# Patient Record
Sex: Female | Born: 1950
Health system: Southern US, Community
[De-identification: ages and names within clinical notes are randomized; demographics above are authoritative.]

## PROBLEM LIST (undated history)

## (undated) DIAGNOSIS — Z720 Tobacco use: Secondary | ICD-10-CM

## (undated) DIAGNOSIS — E611 Iron deficiency: Secondary | ICD-10-CM

## (undated) DIAGNOSIS — N83209 Unspecified ovarian cyst, unspecified side: Secondary | ICD-10-CM

## (undated) DIAGNOSIS — S92901A Unspecified fracture of right foot, initial encounter for closed fracture: Secondary | ICD-10-CM

## (undated) DIAGNOSIS — C801 Malignant (primary) neoplasm, unspecified: Secondary | ICD-10-CM

## (undated) DIAGNOSIS — K029 Dental caries, unspecified: Secondary | ICD-10-CM

## (undated) DIAGNOSIS — F329 Major depressive disorder, single episode, unspecified: Secondary | ICD-10-CM

## (undated) DIAGNOSIS — A159 Respiratory tuberculosis unspecified: Secondary | ICD-10-CM

## (undated) DIAGNOSIS — F32A Depression, unspecified: Secondary | ICD-10-CM

## (undated) DIAGNOSIS — J45909 Unspecified asthma, uncomplicated: Secondary | ICD-10-CM

## (undated) DIAGNOSIS — D649 Anemia, unspecified: Secondary | ICD-10-CM

## (undated) HISTORY — DX: Anemia, unspecified: D64.9

## (undated) HISTORY — PX: INGUINAL HERNIA REPAIR: SUR1180

## (undated) HISTORY — PX: BREAST EXCISIONAL BIOPSY: SUR124

## (undated) HISTORY — PX: COLONOSCOPY: SHX174

## (undated) HISTORY — DX: Respiratory tuberculosis unspecified: A15.9

## (undated) HISTORY — DX: Malignant (primary) neoplasm, unspecified: C80.1

## (undated) HISTORY — PX: DILATION AND CURETTAGE OF UTERUS: SHX78

## (undated) HISTORY — DX: Depression, unspecified: F32.A

## (undated) HISTORY — DX: Major depressive disorder, single episode, unspecified: F32.9

---
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
## 1973-08-07 DIAGNOSIS — N83209 Unspecified ovarian cyst, unspecified side: Secondary | ICD-10-CM

## 1973-08-07 HISTORY — PX: OVARIAN CYST REMOVAL: SHX89

## 1973-08-07 HISTORY — DX: Unspecified ovarian cyst, unspecified side: N83.209

## 1997-12-05 ENCOUNTER — Emergency Department (HOSPITAL_COMMUNITY): Admission: EM | Admit: 1997-12-05 | Discharge: 1997-12-05 | Payer: Self-pay | Admitting: Emergency Medicine

## 1999-12-26 ENCOUNTER — Emergency Department (HOSPITAL_COMMUNITY): Admission: EM | Admit: 1999-12-26 | Discharge: 1999-12-26 | Payer: Self-pay | Admitting: Family Medicine

## 2000-04-03 ENCOUNTER — Other Ambulatory Visit: Admission: RE | Admit: 2000-04-03 | Discharge: 2000-04-03 | Payer: Self-pay | Admitting: Obstetrics and Gynecology

## 2000-04-05 ENCOUNTER — Encounter: Payer: Self-pay | Admitting: Obstetrics and Gynecology

## 2000-04-05 ENCOUNTER — Ambulatory Visit (HOSPITAL_COMMUNITY): Admission: RE | Admit: 2000-04-05 | Discharge: 2000-04-05 | Payer: Self-pay | Admitting: Obstetrics and Gynecology

## 2000-04-12 ENCOUNTER — Other Ambulatory Visit: Admission: RE | Admit: 2000-04-12 | Discharge: 2000-04-12 | Payer: Self-pay | Admitting: Obstetrics and Gynecology

## 2000-07-03 ENCOUNTER — Emergency Department (HOSPITAL_COMMUNITY): Admission: EM | Admit: 2000-07-03 | Discharge: 2000-07-04 | Payer: Self-pay | Admitting: *Deleted

## 2000-07-12 ENCOUNTER — Encounter: Payer: Self-pay | Admitting: Sports Medicine

## 2000-07-12 ENCOUNTER — Encounter: Admission: RE | Admit: 2000-07-12 | Discharge: 2000-07-12 | Payer: Self-pay | Admitting: Sports Medicine

## 2002-11-29 ENCOUNTER — Encounter: Payer: Self-pay | Admitting: Emergency Medicine

## 2002-11-29 ENCOUNTER — Emergency Department (HOSPITAL_COMMUNITY): Admission: EM | Admit: 2002-11-29 | Discharge: 2002-11-29 | Payer: Self-pay | Admitting: Emergency Medicine

## 2002-12-10 ENCOUNTER — Emergency Department (HOSPITAL_COMMUNITY): Admission: EM | Admit: 2002-12-10 | Discharge: 2002-12-10 | Payer: Self-pay | Admitting: Emergency Medicine

## 2002-12-10 ENCOUNTER — Encounter: Payer: Self-pay | Admitting: Emergency Medicine

## 2014-08-07 HISTORY — PX: COLON SURGERY: SHX602

## 2015-04-04 ENCOUNTER — Emergency Department (HOSPITAL_COMMUNITY)
Admission: EM | Admit: 2015-04-04 | Discharge: 2015-04-05 | Disposition: A | Discharge status: Home / Self Care | Payer: Medicare Other | Attending: Emergency Medicine | Admitting: Emergency Medicine

## 2015-04-04 ENCOUNTER — Encounter (HOSPITAL_COMMUNITY): Payer: Self-pay

## 2015-04-04 DIAGNOSIS — K561 Intussusception: Secondary | ICD-10-CM | POA: Insufficient documentation

## 2015-04-04 DIAGNOSIS — K625 Hemorrhage of anus and rectum: Secondary | ICD-10-CM

## 2015-04-04 DIAGNOSIS — J45909 Unspecified asthma, uncomplicated: Secondary | ICD-10-CM | POA: Diagnosis not present

## 2015-04-04 DIAGNOSIS — Z720 Tobacco use: Secondary | ICD-10-CM | POA: Diagnosis not present

## 2015-04-04 DIAGNOSIS — R42 Dizziness and giddiness: Secondary | ICD-10-CM | POA: Insufficient documentation

## 2015-04-04 DIAGNOSIS — Z8742 Personal history of other diseases of the female genital tract: Secondary | ICD-10-CM | POA: Insufficient documentation

## 2015-04-04 HISTORY — DX: Unspecified ovarian cyst, unspecified side: N83.209

## 2015-04-04 HISTORY — DX: Unspecified asthma, uncomplicated: J45.909

## 2015-04-04 NOTE — ED Provider Notes (Signed)
CSN: [id number]     Arrival date & time 04/04/15  2340 History   This chart was scribed for No att. providers found by Forrestine Him, ED Scribe. This patient was seen in room OTFC/OTF and the patient's care was started 11:48 PM.   Chief Complaint  Patient presents with  . Abdominal Pain  . Rectal Bleeding   The history is provided by the patient. No language interpreter was used.    HPI Comments: Carol Dominguez brought in by EMS is a 64 y.o. female without any pertinent past medical history  who presents to the Emergency Department complaining of intermittent, ongoing rectal bleeding x 6 months; worsened this evening. She denies passing blood with every bowel movement but states she noted a steady stream of blood along with blood clots in her stool prior to arrival. Ms. Carol Dominguez also reports associated abdominal pain onset this evening. She admits to feeling lightheaded while sitting on the toilet this evening. No OTC medications or home remedies attempted prior to arrival. No recent fever or chills. No previous follow up with GI. Family history of hemorrhoids reported.  Past Medical History  Diagnosis Date  . Ovarian cyst   . Asthma    History reviewed. No pertinent past surgical history. History reviewed. No pertinent family history. Social History  Substance Use Topics  . Smoking status: Current Every Day Smoker    Types: Cigarettes  . Smokeless tobacco: None  . Alcohol Use: No   OB History    No data available     Review of Systems  Constitutional: Negative for fever and chills.  Respiratory: Negative for cough, shortness of breath and wheezing.   Cardiovascular: Negative for chest pain.  Gastrointestinal: Positive for abdominal pain and blood in stool. Negative for nausea, vomiting, diarrhea and constipation.  Musculoskeletal: Negative for myalgias, back pain, neck pain and neck stiffness.  Skin: Negative for rash and wound.  Neurological: Positive for dizziness and  light-headedness. Negative for syncope, weakness, numbness and headaches.  Psychiatric/Behavioral: Negative for confusion.  All other systems reviewed and are negative.     Allergies  Codeine  Home Medications   Prior to Admission medications   Medication Sig Start Date End Date Taking? Authorizing Provider  ibuprofen (ADVIL,MOTRIN) 200 MG tablet Take 400 mg by mouth every 6 (six) hours as needed for moderate pain.   Yes Historical Provider, MD  docusate sodium (COLACE) 100 MG capsule Take 1 capsule (100 mg total) by mouth every 12 (twelve) hours. 04/05/15   Evelina Bucy, MD  psyllium (METAMUCIL) 0.52 G capsule Take 1 capsule (0.52 g total) by mouth daily. 04/05/15   Evelina Bucy, MD   Triage Vitals: BP 123/57 mmHg  Pulse 53  Resp 11  SpO2 98%   Physical Exam  Constitutional: She is oriented to person, place, and time. She appears well-developed and well-nourished. No distress.  HENT:  Head: Normocephalic and atraumatic.  Mouth/Throat: Oropharynx is clear and moist.  Eyes: EOM are normal. Pupils are equal, round, and reactive to light.  Neck: Normal range of motion. Neck supple.  Cardiovascular: Normal rate and regular rhythm.   Pulmonary/Chest: Effort normal and breath sounds normal. No respiratory distress. She has no wheezes. She has no rales. She exhibits no tenderness.  Abdominal: Soft. Bowel sounds are normal. She exhibits no distension and no mass. There is tenderness (very mild left lower abdominal tenderness to palpation. There is no rebound or guarding.). There is no rebound and no guarding.  Genitourinary:  Gross red blood on rectal exam. No fissures or hemorrhoids appreciated.  Musculoskeletal: Normal range of motion. She exhibits no edema or tenderness.  Neurological: She is alert and oriented to person, place, and time.  Skin: Skin is warm and dry. No rash noted. No erythema.  Psychiatric: She has a normal mood and affect. Her behavior is normal.  Nursing note and  vitals reviewed.   ED Course  Procedures (including critical care time)  DIAGNOSTIC STUDIES: Oxygen Saturation is 99% on room air, normal by my interpretation.    COORDINATION OF CARE: 11:57 PM-Discussed treatment plan with pt at bedside and pt agreed to plan.     Labs Review Labs Reviewed  CBC WITH DIFFERENTIAL/PLATELET - Abnormal; Notable for the following:    RBC 3.71 (*)    Hemoglobin 10.9 (*)    HCT 33.2 (*)    Eosinophils Relative 7 (*)    All other components within normal limits  COMPREHENSIVE METABOLIC PANEL - Abnormal; Notable for the following:    Glucose, Bld 117 (*)    Creatinine, Ser 1.32 (*)    Calcium 8.0 (*)    Total Protein 4.5 (*)    Albumin 2.4 (*)    Total Bilirubin 0.2 (*)    GFR calc non Af Amer 42 (*)    GFR calc Af Amer 49 (*)    Anion gap 4 (*)    All other components within normal limits  LIPASE, BLOOD - Abnormal; Notable for the following:    Lipase 18 (*)    All other components within normal limits  POC OCCULT BLOOD, ED - Abnormal; Notable for the following:    Fecal Occult Bld POSITIVE (*)    All other components within normal limits  PROTIME-INR  APTT  TYPE AND SCREEN  ABO/RH    Imaging Review Ct Abdomen Pelvis W Contrast  04/05/2015   CLINICAL DATA:  Intermittent rectal bleeding for 6 months, today much worse.  EXAM: CT ABDOMEN AND PELVIS WITH CONTRAST  TECHNIQUE: Multidetector CT imaging of the abdomen and pelvis was performed using the standard protocol following bolus administration of intravenous contrast.  CONTRAST:  128mL OMNIPAQUE IOHEXOL 300 MG/ML  SOLN  COMPARISON:  None.  FINDINGS: There is a colocolic intussusception of the sigmoid into the rectum. At the leading edge of the intussusception there is a 3 cm enhancing soft tissue lesion which may represent a neoplasm. There is no significant bowel obstruction associated with the intussusception. 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 There is no extraluminal air. There is no pneumatosis.  There are normal appearances of the liver, spleen, pancreas, adrenals and kidneys. The abdominal aorta is normal in caliber with moderate atherosclerotic calcification. There is mild posteromedial left lower lobe pleural parenchymal opacity which probably represents scarring. There is no significant skeletal lesion. Moderate lumbar degenerative disc changes are present from L 3 through L5.  IMPRESSION: 1. Intussusception of the sigmoid into the rectum, with abnormal enhancing soft tissue at the leading edge of the intussusception suspicious for a 3 cm neoplasm. These results were called by telephone at the time of interpretation on 04/05/2015 at 2:59 am to Dr. Julianne Rice , who verbally acknowledged these results. 2. No significant bowel obstruction associated with the intussusception. 3. 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   Electronically  Signed   By: Andreas Newport M.D.   On: 04/05/2015 03:06   I have personally reviewed and evaluated these images and lab results as part of my medical decision-making.   EKG Interpretation None      MDM   Final diagnoses:  Intussusception  Rectal bleeding    I personally performed the services described in this documentation, which was scribed in my presence. The recorded information has been reviewed and is accurate.  Patient appears comfortable. Discussed with general surgery, Dr. Barry Dienes. Will evaluate in the emergency department.  Signed out to oncoming EDP pending dispo by surgery  Julianne Rice, MD 04/06/15 [phone]

## 2015-04-04 NOTE — ED Notes (Signed)
Per EMS: PT complaining of passing of clots while having a BM x 6 months. Last 2 weeks has noticed some bright red blood in addition to the clots. Today, noticed a steady stream of blood with BM. States family hx of hemmroids. Also complains of LL abdominal pain on palpation. BP 142/84, 76bpm, 98% RA.

## 2015-04-05 ENCOUNTER — Encounter (HOSPITAL_COMMUNITY): Payer: Self-pay | Admitting: Radiology

## 2015-04-05 ENCOUNTER — Emergency Department (HOSPITAL_COMMUNITY): Payer: Medicare Other

## 2015-04-05 DIAGNOSIS — C20 Malignant neoplasm of rectum: Secondary | ICD-10-CM | POA: Diagnosis not present

## 2015-04-05 DIAGNOSIS — K623 Rectal prolapse: Secondary | ICD-10-CM | POA: Diagnosis not present

## 2015-04-05 LAB — CBC WITH DIFFERENTIAL/PLATELET
Basophils Absolute: 0 10*3/uL (ref 0.0–0.1)
Basophils Relative: 1 % (ref 0–1)
Eosinophils Absolute: 0.4 10*3/uL (ref 0.0–0.7)
Eosinophils Relative: 7 % — ABNORMAL HIGH (ref 0–5)
HEMATOCRIT: 33.2 % — AB (ref 36.0–46.0)
HEMOGLOBIN: 10.9 g/dL — AB (ref 12.0–15.0)
LYMPHS ABS: 1.7 10*3/uL (ref 0.7–4.0)
LYMPHS PCT: 31 % (ref 12–46)
MCH: 29.4 pg (ref 26.0–34.0)
MCHC: 32.8 g/dL (ref 30.0–36.0)
MCV: 89.5 fL (ref 78.0–100.0)
Monocytes Absolute: 0.4 10*3/uL (ref 0.1–1.0)
Monocytes Relative: 7 % (ref 3–12)
NEUTROS ABS: 3 10*3/uL (ref 1.7–7.7)
NEUTROS PCT: 54 % (ref 43–77)
Platelets: 272 10*3/uL (ref 150–400)
RBC: 3.71 MIL/uL — AB (ref 3.87–5.11)
RDW: 13.7 % (ref 11.5–15.5)
WBC: 5.5 10*3/uL (ref 4.0–10.5)

## 2015-04-05 LAB — COMPREHENSIVE METABOLIC PANEL
ALK PHOS: 56 U/L (ref 38–126)
ALT: 14 U/L (ref 14–54)
AST: 20 U/L (ref 15–41)
Albumin: 2.4 g/dL — ABNORMAL LOW (ref 3.5–5.0)
Anion gap: 4 — ABNORMAL LOW (ref 5–15)
BUN: 15 mg/dL (ref 6–20)
CALCIUM: 8 mg/dL — AB (ref 8.9–10.3)
CO2: 28 mmol/L (ref 22–32)
CREATININE: 1.32 mg/dL — AB (ref 0.44–1.00)
Chloride: 108 mmol/L (ref 101–111)
GFR, EST AFRICAN AMERICAN: 49 mL/min — AB (ref >60)
GFR, EST NON AFRICAN AMERICAN: 42 mL/min — AB (ref >60)
Glucose, Bld: 117 mg/dL — ABNORMAL HIGH (ref 65–99)
Potassium: 4 mmol/L (ref 3.5–5.1)
SODIUM: 140 mmol/L (ref 135–145)
Total Bilirubin: 0.2 mg/dL — ABNORMAL LOW (ref 0.3–1.2)
Total Protein: 4.5 g/dL — ABNORMAL LOW (ref 6.5–8.1)

## 2015-04-05 LAB — TYPE AND SCREEN
ABO/RH(D): B POS
ANTIBODY SCREEN: NEGATIVE

## 2015-04-05 LAB — ABO/RH: ABO/RH(D): B POS

## 2015-04-05 LAB — POC OCCULT BLOOD, ED: Fecal Occult Bld: POSITIVE — AB

## 2015-04-05 LAB — APTT: aPTT: 29 seconds (ref 24–37)

## 2015-04-05 LAB — PROTIME-INR
INR: 1.05 (ref 0.00–1.49)
PROTHROMBIN TIME: 13.9 s (ref 11.6–15.2)

## 2015-04-05 LAB — LIPASE, BLOOD: Lipase: 18 U/L — ABNORMAL LOW (ref 22–51)

## 2015-04-05 MED ORDER — IOHEXOL 300 MG/ML  SOLN: 25.0000 mL | Freq: Once | INTRAMUSCULAR | Status: DC | PRN

## 2015-04-05 MED ORDER — HYDROMORPHONE HCL 1 MG/ML IJ SOLN
1.0000 mg | Freq: Once | INTRAMUSCULAR | Status: AC
  Administered 2015-04-05: 1 mg via INTRAVENOUS
  Filled 2015-04-05: qty 1

## 2015-04-05 MED ORDER — IOHEXOL 300 MG/ML  SOLN
100.0000 mL | Freq: Once | INTRAMUSCULAR | Status: AC | PRN
  Administered 2015-04-05: 100 mL via INTRAVENOUS

## 2015-04-05 MED ORDER — PSYLLIUM 0.52 G PO CAPS: 0.5200 g | ORAL_CAPSULE | Freq: Every day | ORAL | Status: DC

## 2015-04-05 MED ORDER — DOCUSATE SODIUM 100 MG PO CAPS: 100.0000 mg | ORAL_CAPSULE | Freq: Two times a day (BID) | ORAL | Status: DC

## 2015-04-05 NOTE — ED Notes (Signed)
Pt taken to lobby in wheelchair by NT. Pt states she will take the bus to go home.

## 2015-04-05 NOTE — ED Notes (Signed)
Surgery at bedside.

## 2015-04-05 NOTE — ED Notes (Signed)
Reviewed D/C papers with pt - pt requests to ambulate to restroom. While in restroom pt calls out with call bell stating "it's coming out again." This RN went in restroom, noted intestines coming out of rectum, pt c/o pain in abdomen. EDP made aware. Wet sterile gauze and abd pad placed on intestines.

## 2015-04-05 NOTE — Progress Notes (Signed)
Called back by Dr. Mingo Amber because the patients rectum re-prolapsed.  He thought there was some type of polyp on it and he wanted [place] to re-evaluate her to see if she warranted inpatient or needed more urgent follow up.  There is a ulcerated lesion, unsure if its is a internal hemorrhoid or ulcerative mass.  The ulcerative area is approximately 3cm x 1.5cm.  The rest of the mucosa is smooth and pink as below.  I easily pushed in the rectum with little effort with a sterile water soaked gauze.  Instructed the patient on re-inserting it at home.  She may need to use the shower/bath to help it go in more easily.  She will need to be on colace, miralax to keep the stools loose.    I've made her an appointment on September 6th at 10:30 with Dr. Marcello Moores our colorectal specialist, she should arrive by 10:00am to check in and fill out paperwork.       Jomarie Longs, PA-C General Surgery [facility] [phone]

## 2015-04-05 NOTE — Consult Note (Signed)
Reason for Consult:blood per rectum Referring Physician: Ferrel Logan is an 64 y.o. female.  HPI:  Patient is a 64 yo F who presents with new onset abdominal pain with worsening blood per rectum.  She has had blood per rectum for around 6 months or so, but this evening, she had a larger amount of blood and some clots.  She has no PCP and does not go to doctors.  She has not had any recent mammogram or GI doctor.  She denies any family or personal history of cancer or GI diseases other than hemorrhoids.  She has assumed the bleeding was related to hemorrhoids.  She does sometimes feel tissue coming out of her rectum.  The pain today described as mild to moderate.  The pain was concerning to her.      Past Medical History  Diagnosis Date  . Ovarian cyst   . Asthma     History reviewed. No pertinent past surgical history.  History reviewed. No pertinent family history.  Social History:  reports that she has been smoking Cigarettes.  She does not have any smokeless tobacco history on file. She reports that she does not drink alcohol or use illicit drugs.  Allergies:  Allergies  Allergen Reactions  . Codeine     "Makes me sick."    Medications: ibuprofen  Results for orders placed or performed during the hospital encounter of 04/04/15 (from the past 48 hour(s))  POC occult blood, ED Provider will collect     Status: Abnormal   Collection Time: 04/05/15 12:11 AM  Result Value Ref Range   Fecal Occult Bld POSITIVE (A) NEGATIVE  CBC with Differential/Platelet     Status: Abnormal   Collection Time: 04/05/15 12:18 AM  Result Value Ref Range   WBC 5.5 4.0 - 10.5 K/uL   RBC 3.71 (L) 3.87 - 5.11 MIL/uL   Hemoglobin 10.9 (L) 12.0 - 15.0 g/dL   HCT 33.2 (L) 36.0 - 46.0 %   MCV 89.5 78.0 - 100.0 fL   MCH 29.4 26.0 - 34.0 pg   MCHC 32.8 30.0 - 36.0 g/dL   RDW 13.7 11.5 - 15.5 %   Platelets 272 150 - 400 K/uL   Neutrophils Relative % 54 43 - 77 %   Neutro Abs 3.0 1.7  - 7.7 K/uL   Lymphocytes Relative 31 12 - 46 %   Lymphs Abs 1.7 0.7 - 4.0 K/uL   Monocytes Relative 7 3 - 12 %   Monocytes Absolute 0.4 0.1 - 1.0 K/uL   Eosinophils Relative 7 (H) 0 - 5 %   Eosinophils Absolute 0.4 0.0 - 0.7 K/uL   Basophils Relative 1 0 - 1 %   Basophils Absolute 0.0 0.0 - 0.1 K/uL  Comprehensive metabolic panel     Status: Abnormal   Collection Time: 04/05/15 12:18 AM  Result Value Ref Range   Sodium 140 135 - 145 mmol/L   Potassium 4.0 3.5 - 5.1 mmol/L   Chloride 108 101 - 111 mmol/L   CO2 28 22 - 32 mmol/L   Glucose, Bld 117 (H) 65 - 99 mg/dL   BUN 15 6 - 20 mg/dL   Creatinine, Ser 1.32 (H) 0.44 - 1.00 mg/dL   Calcium 8.0 (L) 8.9 - 10.3 mg/dL   Total Protein 4.5 (L) 6.5 - 8.1 g/dL   Albumin 2.4 (L) 3.5 - 5.0 g/dL   AST 20 15 - 41 U/L   ALT 14 14 - 54  U/L   Alkaline Phosphatase 56 38 - 126 U/L   Total Bilirubin 0.2 (L) 0.3 - 1.2 mg/dL   GFR calc non Af Amer 42 (L) >60 mL/min   GFR calc Af Amer 49 (L) >60 mL/min    Comment: (NOTE) The eGFR has been calculated using the CKD EPI equation. This calculation has not been validated in all clinical situations. eGFR's persistently <60 mL/min signify possible Chronic Kidney Disease.    Anion gap 4 (L) 5 - 15  Lipase, blood     Status: Abnormal   Collection Time: 04/05/15 12:18 AM  Result Value Ref Range   Lipase 18 (L) 22 - 51 U/L  Protime-INR     Status: None   Collection Time: 04/05/15 12:18 AM  Result Value Ref Range   Prothrombin Time 13.9 11.6 - 15.2 seconds   INR 1.05 0.00 - 1.49  APTT     Status: None   Collection Time: 04/05/15 12:18 AM  Result Value Ref Range   aPTT 29 24 - 37 seconds  Type and screen     Status: None   Collection Time: 04/05/15 12:18 AM  Result Value Ref Range   ABO/RH(D) B POS    Antibody Screen NEG    Sample Expiration 04/08/2015   ABO/Rh     Status: None   Collection Time: 04/05/15 12:18 AM  Result Value Ref Range   ABO/RH(D) B POS     Ct Abdomen Pelvis W  Contrast  04/05/2015   CLINICAL DATA:  Intermittent rectal bleeding for 6 months, today much worse.  EXAM: CT ABDOMEN AND PELVIS WITH CONTRAST  TECHNIQUE: Multidetector CT imaging of the abdomen and pelvis was performed using the standard protocol following bolus administration of intravenous contrast.  CONTRAST:  147m OMNIPAQUE IOHEXOL 300 MG/ML  SOLN  COMPARISON:  None.  FINDINGS: There is a colocolic intussusception of the sigmoid into the rectum. At the leading edge of the intussusception there is a 3 cm enhancing soft tissue lesion which may represent a neoplasm. There is no significant bowel obstruction associated with the intussusception. 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 There is no extraluminal air. There is no pneumatosis.  There are normal appearances of the liver, spleen, pancreas, adrenals and kidneys. The abdominal aorta is normal in caliber with moderate atherosclerotic calcification. There is mild posteromedial left lower lobe pleural parenchymal opacity which probably represents scarring. There is no significant skeletal lesion. Moderate lumbar degenerative disc changes are present from L 3 through L5.  IMPRESSION: 1. Intussusception of the sigmoid into the rectum, with abnormal enhancing soft tissue at the leading edge of the intussusception suspicious for a 3 cm neoplasm. These results were called by telephone at the time of interpretation on 04/05/2015 at 2:59 am to Dr. DJulianne Rice, who verbally acknowledged these results. 2. No significant bowel obstruction associated with the intussusception. 3. 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   Electronically Signed   By: DAndreas NewportM.D.   On: 04/05/2015 03:06    Review of Systems  Constitutional: Negative.   HENT: Negative.   Eyes: Negative.   Respiratory:  Negative.   Cardiovascular: Negative.   Gastrointestinal: Positive for abdominal pain and blood in stool. Negative for nausea and vomiting.  Genitourinary: Negative.   Musculoskeletal: Negative.   Skin: Negative.  Neurological: Negative.   Endo/Heme/Allergies: Negative.   Psychiatric/Behavioral: Negative.    Blood pressure 166/84, pulse 76, resp. rate 18, SpO2 99 %. Physical Exam  Constitutional: She is oriented to person, place, and time. She appears well-developed and well-nourished. No distress.  HENT:  Head: Normocephalic and atraumatic.  Eyes: Conjunctivae are normal. Pupils are equal, round, and reactive to light. No scleral icterus.  Neck: Neck supple. No JVD present. No tracheal deviation present. No thyromegaly present.  Cardiovascular: Normal rate, regular rhythm and intact distal pulses.   Respiratory: Effort normal. No respiratory distress.  GI: Soft. She exhibits no distension and no mass. There is no tenderness. There is no rebound and no guarding.  Musculoskeletal: She exhibits no edema or tenderness.  Lymphadenopathy:    She has no cervical adenopathy.  Neurological: She is alert and oriented to person, place, and time.  Skin: Skin is warm and dry. No rash noted. She is not diaphoretic. No erythema. No pallor.  Psychiatric: She has a normal mood and affect. Her behavior is normal. Judgment and thought content normal.    Assessment/Plan: GI Bleed Sigmoid intussusception Malrotation Colon mass  High fiber diet. Avoid prolonged time on the toilet Would recommend workup with colonoscopy. Malrotation appears to be compensated by the patient with small bowel in right abdomen and colon in left abdomen.   Does not need urgent surgical intervention.    Patient seems stable for discharge. Would recommend accelerated outpatient workup as long as she remains stable for discharge. Will set up appointment with Dr. Marcello Moores at our practice within 1 week.       Timothea Bodenheimer 04/05/2015, 10:36 AM

## 2015-04-05 NOTE — ED Notes (Signed)
Pt previously signed for d/c instructions - verbalized understanding, no further questions/concerns.

## 2015-04-05 NOTE — Discharge Instructions (Signed)
Please avoid sitting on the toilet for extended periods of time. Please take the stool softeners (Colace/Docusate) and the fiber supplements to help alleviate any constipation.  Please follow up with Dr. Marcello Moores with Surgery as scheduled.    Abdominal Pain Many things can cause abdominal pain. Usually, abdominal pain is not caused by a disease and will improve without treatment. It can often be observed and treated at home. Your health care provider will do a physical exam and possibly order blood tests and X-rays to help determine the seriousness of your pain. However, in many cases, more time must pass before a clear cause of the pain can be found. Before that point, your health care provider may not know if you need more testing or further treatment. HOME CARE INSTRUCTIONS  Monitor your abdominal pain for any changes. The following actions may help to alleviate any discomfort you are experiencing:  Only take over-the-counter or prescription medicines as directed by your health care provider.  Do not take laxatives unless directed to do so by your health care provider.  Try a clear liquid diet (broth, tea, or water) as directed by your health care provider. Slowly move to a bland diet as tolerated. SEEK MEDICAL CARE IF:  You have unexplained abdominal pain.  You have abdominal pain associated with nausea or diarrhea.  You have pain when you urinate or have a bowel movement.  You experience abdominal pain that wakes you in the night.  You have abdominal pain that is worsened or improved by eating food.  You have abdominal pain that is worsened with eating fatty foods.  You have a fever. SEEK IMMEDIATE MEDICAL CARE IF:   Your pain does not go away within 2 hours.  You keep throwing up (vomiting).  Your pain is felt only in portions of the abdomen, such as the right side or the left lower portion of the abdomen.  You pass bloody or black tarry stools. MAKE SURE  YOU:  Understand these instructions.   Will watch your condition.   Will get help right away if you are not doing well or get worse.  Document Released: 05/03/2005 Document Revised: 07/29/2013 Document Reviewed: 04/02/2013 [facility] Patient Information 2015 [place], [place]. This information is not intended to replace advice given to you by your health care provider. Make sure you discuss any questions you have with your health care provider.

## 2015-04-08 ENCOUNTER — Inpatient Hospital Stay (HOSPITAL_COMMUNITY): Payer: Medicare Other

## 2015-04-08 ENCOUNTER — Inpatient Hospital Stay (HOSPITAL_COMMUNITY)
Admission: EM | Admit: 2015-04-08 | Discharge: 2015-04-13 | DRG: 330 | Disposition: A | Discharge status: Home Health | Payer: Medicare Other | Attending: Surgery | Admitting: Surgery

## 2015-04-08 ENCOUNTER — Encounter (HOSPITAL_COMMUNITY): Payer: Self-pay | Admitting: Emergency Medicine

## 2015-04-08 DIAGNOSIS — K6289 Other specified diseases of anus and rectum: Secondary | ICD-10-CM | POA: Diagnosis present

## 2015-04-08 DIAGNOSIS — K625 Hemorrhage of anus and rectum: Secondary | ICD-10-CM | POA: Diagnosis present

## 2015-04-08 DIAGNOSIS — Q438 Other specified congenital malformations of intestine: Secondary | ICD-10-CM

## 2015-04-08 DIAGNOSIS — K660 Peritoneal adhesions (postprocedural) (postinfection): Secondary | ICD-10-CM | POA: Diagnosis present

## 2015-04-08 DIAGNOSIS — Z01818 Encounter for other preprocedural examination: Secondary | ICD-10-CM

## 2015-04-08 DIAGNOSIS — Z681 Body mass index (BMI) 19 or less, adult: Secondary | ICD-10-CM | POA: Diagnosis not present

## 2015-04-08 DIAGNOSIS — E46 Unspecified protein-calorie malnutrition: Secondary | ICD-10-CM | POA: Diagnosis present

## 2015-04-08 DIAGNOSIS — K567 Ileus, unspecified: Secondary | ICD-10-CM | POA: Diagnosis not present

## 2015-04-08 DIAGNOSIS — Z720 Tobacco use: Secondary | ICD-10-CM | POA: Diagnosis present

## 2015-04-08 DIAGNOSIS — F17210 Nicotine dependence, cigarettes, uncomplicated: Secondary | ICD-10-CM | POA: Diagnosis present

## 2015-04-08 DIAGNOSIS — K5669 Other intestinal obstruction: Secondary | ICD-10-CM | POA: Diagnosis present

## 2015-04-08 DIAGNOSIS — C801 Malignant (primary) neoplasm, unspecified: Secondary | ICD-10-CM

## 2015-04-08 DIAGNOSIS — K623 Rectal prolapse: Secondary | ICD-10-CM | POA: Diagnosis present

## 2015-04-08 DIAGNOSIS — C20 Malignant neoplasm of rectum: Secondary | ICD-10-CM | POA: Diagnosis present

## 2015-04-08 DIAGNOSIS — E876 Hypokalemia: Secondary | ICD-10-CM | POA: Diagnosis not present

## 2015-04-08 DIAGNOSIS — K561 Intussusception: Secondary | ICD-10-CM | POA: Diagnosis present

## 2015-04-08 DIAGNOSIS — E440 Moderate protein-calorie malnutrition: Secondary | ICD-10-CM

## 2015-04-08 DIAGNOSIS — F40240 Claustrophobia: Secondary | ICD-10-CM

## 2015-04-08 HISTORY — DX: Unspecified fracture of right foot, initial encounter for closed fracture: S92.901A

## 2015-04-08 HISTORY — DX: Malignant (primary) neoplasm, unspecified: C80.1

## 2015-04-08 HISTORY — DX: Dental caries, unspecified: K02.9

## 2015-04-08 HISTORY — DX: Iron deficiency: E61.1

## 2015-04-08 HISTORY — DX: Tobacco use: Z72.0

## 2015-04-08 LAB — CBC
HCT: 35.9 % — ABNORMAL LOW (ref 36.0–46.0)
Hemoglobin: 12 g/dL (ref 12.0–15.0)
MCH: 29.8 pg (ref 26.0–34.0)
MCHC: 33.4 g/dL (ref 30.0–36.0)
MCV: 89.1 fL (ref 78.0–100.0)
PLATELETS: 273 10*3/uL (ref 150–400)
RBC: 4.03 MIL/uL (ref 3.87–5.11)
RDW: 13.4 % (ref 11.5–15.5)
WBC: 6.2 10*3/uL (ref 4.0–10.5)

## 2015-04-08 LAB — COMPREHENSIVE METABOLIC PANEL
ALK PHOS: 46 U/L (ref 38–126)
ALT: 18 U/L (ref 14–54)
AST: 38 U/L (ref 15–41)
Albumin: 2.7 g/dL — ABNORMAL LOW (ref 3.5–5.0)
Anion gap: 6 (ref 5–15)
BUN: 7 mg/dL (ref 6–20)
CALCIUM: 8.3 mg/dL — AB (ref 8.9–10.3)
CO2: 28 mmol/L (ref 22–32)
CREATININE: 1.08 mg/dL — AB (ref 0.44–1.00)
Chloride: 107 mmol/L (ref 101–111)
GFR calc non Af Amer: 54 mL/min — ABNORMAL LOW (ref >60)
GLUCOSE: 100 mg/dL — AB (ref 65–99)
Potassium: 3.5 mmol/L (ref 3.5–5.1)
SODIUM: 141 mmol/L (ref 135–145)
Total Bilirubin: 0.9 mg/dL (ref 0.3–1.2)
Total Protein: 5.3 g/dL — ABNORMAL LOW (ref 6.5–8.1)

## 2015-04-08 LAB — SURGICAL PCR SCREEN
MRSA, PCR: NEGATIVE
Staphylococcus aureus: NEGATIVE

## 2015-04-08 MED ORDER — NEOMYCIN SULFATE 500 MG PO TABS
1000.0000 mg | ORAL_TABLET | Freq: Once | ORAL | Status: AC
  Administered 2015-04-09: 1000 mg via ORAL
  Filled 2015-04-08 (×2): qty 2

## 2015-04-08 MED ORDER — ENOXAPARIN SODIUM 40 MG/0.4ML ~~LOC~~ SOLN: 40.0000 mg | Freq: Once | SUBCUTANEOUS | Status: DC

## 2015-04-08 MED ORDER — LACTATED RINGERS IV BOLUS (SEPSIS): 1000.0000 mL | Freq: Three times a day (TID) | INTRAVENOUS | Status: AC | PRN

## 2015-04-08 MED ORDER — CEFOTETAN DISODIUM 2 G IJ SOLR
2.0000 g | INTRAMUSCULAR | Status: AC
  Administered 2015-04-09: 2 g via INTRAVENOUS
  Filled 2015-04-08: qty 2

## 2015-04-08 MED ORDER — POLYETHYLENE GLYCOL 3350 17 G PO PACK
17.0000 g | PACK | Freq: Four times a day (QID) | ORAL | Status: AC
  Administered 2015-04-08 – 2015-04-09 (×2): 17 g via ORAL
  Filled 2015-04-08 (×2): qty 1

## 2015-04-08 MED ORDER — HEPARIN SODIUM (PORCINE) 5000 UNIT/ML IJ SOLN: 5000.0000 [IU] | Freq: Once | INTRAMUSCULAR | Status: DC

## 2015-04-08 MED ORDER — BUPIVACAINE LIPOSOME 1.3 % IJ SUSP
20.0000 mL | INTRAMUSCULAR | Status: AC
  Filled 2015-04-08 (×2): qty 20

## 2015-04-08 MED ORDER — ONDANSETRON HCL 4 MG/2ML IJ SOLN
4.0000 mg | Freq: Four times a day (QID) | INTRAMUSCULAR | Status: DC | PRN
  Administered 2015-04-09: 4 mg via INTRAVENOUS
  Filled 2015-04-08: qty 2

## 2015-04-08 MED ORDER — NEOMYCIN SULFATE 500 MG PO TABS: 500.0000 mg | ORAL_TABLET | ORAL | Status: DC

## 2015-04-08 MED ORDER — METRONIDAZOLE 500 MG PO TABS: 500.0000 mg | ORAL_TABLET | ORAL | Status: DC

## 2015-04-08 MED ORDER — METRONIDAZOLE 500 MG PO TABS
1000.0000 mg | ORAL_TABLET | Freq: Once | ORAL | Status: AC
  Administered 2015-04-09: 1000 mg via ORAL
  Filled 2015-04-08 (×2): qty 2

## 2015-04-08 MED ORDER — ENOXAPARIN SODIUM 40 MG/0.4ML ~~LOC~~ SOLN
40.0000 mg | Freq: Every day | SUBCUTANEOUS | Status: DC
  Administered 2015-04-09 – 2015-04-12 (×4): 40 mg via SUBCUTANEOUS
  Filled 2015-04-08 (×5): qty 0.4

## 2015-04-08 MED ORDER — MAGIC MOUTHWASH
15.0000 mL | Freq: Four times a day (QID) | ORAL | Status: DC | PRN
  Filled 2015-04-08: qty 15

## 2015-04-08 MED ORDER — LIP MEDEX EX OINT
1.0000 "application " | TOPICAL_OINTMENT | Freq: Two times a day (BID) | CUTANEOUS | Status: DC
  Administered 2015-04-08 – 2015-04-13 (×7): 1 via TOPICAL
  Filled 2015-04-08 (×2): qty 7

## 2015-04-08 MED ORDER — ALVIMOPAN 12 MG PO CAPS: 12.0000 mg | ORAL_CAPSULE | Freq: Once | ORAL | Status: DC

## 2015-04-08 MED ORDER — NEOMYCIN SULFATE 500 MG PO TABS
1000.0000 mg | ORAL_TABLET | ORAL | Status: AC
Start: 2015-04-08 — End: 2015-04-09
  Administered 2015-04-08 – 2015-04-09 (×2): 1000 mg via ORAL
  Filled 2015-04-08 (×2): qty 2

## 2015-04-08 MED ORDER — POLYETHYLENE GLYCOL 3350 17 G PO PACK: 17.0000 g | PACK | Freq: Two times a day (BID) | ORAL | Status: DC

## 2015-04-08 MED ORDER — ENOXAPARIN SODIUM 40 MG/0.4ML ~~LOC~~ SOLN
40.0000 mg | Freq: Every day | SUBCUTANEOUS | Status: DC
  Filled 2015-04-08: qty 0.4

## 2015-04-08 MED ORDER — LACTATED RINGERS IV BOLUS (SEPSIS)
1000.0000 mL | Freq: Once | INTRAVENOUS | Status: AC
  Administered 2015-04-08: 1000 mL via INTRAVENOUS

## 2015-04-08 MED ORDER — ACETAMINOPHEN 325 MG PO TABS: 325.0000 mg | ORAL_TABLET | Freq: Four times a day (QID) | ORAL | Status: DC | PRN

## 2015-04-08 MED ORDER — ALVIMOPAN 12 MG PO CAPS
12.0000 mg | ORAL_CAPSULE | ORAL | Status: AC
  Administered 2015-04-09: 12 mg via ORAL
  Filled 2015-04-08 (×2): qty 1

## 2015-04-08 MED ORDER — DEXTROSE 5 % IV SOLN: 2.0000 g | INTRAVENOUS | Status: DC

## 2015-04-08 MED ORDER — CHLORHEXIDINE GLUCONATE 4 % EX LIQD
60.0000 mL | Freq: Once | CUTANEOUS | Status: AC
  Administered 2015-04-08: 4 via TOPICAL
  Filled 2015-04-08: qty 60

## 2015-04-08 MED ORDER — METRONIDAZOLE 500 MG PO TABS
1000.0000 mg | ORAL_TABLET | ORAL | Status: AC
  Administered 2015-04-08 – 2015-04-09 (×2): 1000 mg via ORAL
  Filled 2015-04-08 (×2): qty 2

## 2015-04-08 MED ORDER — CHLORHEXIDINE GLUCONATE 4 % EX LIQD
60.0000 mL | CUTANEOUS | Status: AC
  Administered 2015-04-09: 4 via TOPICAL
  Filled 2015-04-08: qty 60

## 2015-04-08 MED ORDER — CHLORHEXIDINE GLUCONATE 4 % EX LIQD: 60.0000 mL | Freq: Once | CUTANEOUS | Status: DC

## 2015-04-08 MED ORDER — MENTHOL 3 MG MT LOZG
1.0000 | LOZENGE | OROMUCOSAL | Status: DC | PRN
  Filled 2015-04-08: qty 9

## 2015-04-08 MED ORDER — PNEUMOCOCCAL VAC POLYVALENT 25 MCG/0.5ML IJ INJ
0.5000 mL | INJECTION | INTRAMUSCULAR | Status: AC
  Administered 2015-04-10: 0.5 mL via INTRAMUSCULAR
  Filled 2015-04-08 (×3): qty 0.5

## 2015-04-08 MED ORDER — MORPHINE SULFATE (PF) 4 MG/ML IV SOLN: 1.0000 mg | INTRAVENOUS | Status: DC | PRN

## 2015-04-08 MED ORDER — PROMETHAZINE HCL 25 MG/ML IJ SOLN
6.2500 mg | INTRAMUSCULAR | Status: DC | PRN
  Administered 2015-04-08: 12.5 mg via INTRAVENOUS
  Filled 2015-04-08: qty 1

## 2015-04-08 MED ORDER — INFLUENZA VAC SPLIT QUAD 0.5 ML IM SUSY
0.5000 mL | PREFILLED_SYRINGE | INTRAMUSCULAR | Status: AC
  Administered 2015-04-10: 0.5 mL via INTRAMUSCULAR
  Filled 2015-04-08 (×3): qty 0.5

## 2015-04-08 MED ORDER — ONDANSETRON HCL 4 MG/2ML IJ SOLN
4.0000 mg | Freq: Once | INTRAMUSCULAR | Status: AC
  Administered 2015-04-08: 4 mg via INTRAVENOUS
  Filled 2015-04-08: qty 2

## 2015-04-08 MED ORDER — PHENOL 1.4 % MT LIQD: 2.0000 | OROMUCOSAL | Status: DC | PRN

## 2015-04-08 MED ORDER — SODIUM CHLORIDE 0.9 % IV SOLN
INTRAVENOUS | Status: AC
  Filled 2015-04-08: qty 6

## 2015-04-08 MED ORDER — MORPHINE SULFATE (PF) 4 MG/ML IV SOLN
4.0000 mg | Freq: Once | INTRAVENOUS | Status: AC
  Administered 2015-04-08: 4 mg via INTRAVENOUS
  Filled 2015-04-08: qty 1

## 2015-04-08 MED ORDER — MORPHINE SULFATE (PF) 2 MG/ML IV SOLN
2.0000 mg | INTRAVENOUS | Status: DC | PRN
  Administered 2015-04-08 – 2015-04-09 (×6): 2 mg via INTRAVENOUS
  Administered 2015-04-11 – 2015-04-12 (×3): 4 mg via INTRAVENOUS
  Filled 2015-04-08 (×3): qty 1
  Filled 2015-04-08: qty 2
  Filled 2015-04-08: qty 1
  Filled 2015-04-08 (×2): qty 2
  Filled 2015-04-08 (×2): qty 1

## 2015-04-08 NOTE — ED Notes (Signed)
Pt did not have barium enema due to discussion between radiology and Dr. Johney [place]

## 2015-04-08 NOTE — ED Notes (Signed)
Pt seen on Aug 28th for rectal prolapse. Pt is needing surgery. Pt was sent home with pain medicine and has not been able to afford it. Pt is still in pain and unable to have BM other than "gel looking stuff with some blood". BP 128/78, HR 90

## 2015-04-08 NOTE — H&P (Signed)
[facility] Admission Note  Carol Dominguez 10-29-1950  [id number].    Requesting MD: Dr. Ashok Cordia Chief Complaint/Reason for Consult: Recurrent rectal prolapse, intersuception  HPI:  64 y/o AA female who presented to [facility] on 04/05/15 with new onset abdominal/rectal pain with worsening blood per rectum. She has had blood per rectum for around 6 months or so, but recently she had a larger amount of blood and some clots. She has assumed the bleeding was related to hemorrhoids. She just started noticing the prolapsed tissue at her rectum. Her pain can get to a 10/10 in her rectum and cramping pain in her abdomen.  She's been nauseated, vomiting, only able to keep down liquids for almost 2 weeks. She was discharged to follow up with Dr. Marcello Moores of [facility] for further evaluation and management, but she keeps prolapsing at home and she's been able to push it in herself once, but this time it wouldn't go back in.  She hasn't had an significant BM's in 2 weeks due to low oral intake.  She does c/o mucous and a little blood currently.  She's been mostly laying down to prevent it from coming back out.  No radiating pain, no other alleviating/aggrevating factors.  She doesn't go to doctors. She has not had any recent mammogram, CSP or GI doctor. She denies any family or personal history of cancer or GI diseases other than hemorrhoids. H/o open ovarian cyst removal 45 years ago, and right inguinal hernia repair at 24-39 years old.  No other abdominal surgeries.     ROS: All systems reviewed and otherwise negative except for as above  History reviewed. No pertinent family history.  Past Medical History  Diagnosis Date  . Ovarian cyst 1975    [facility]  . Asthma   . Teeth decayed   . Iron deficiency   . Foot fracture, right   . Tobacco use     Past Surgical History  Procedure Laterality Date  . Ovarian cyst removal Right 1975    Dr. Ronnald Ramp  . Inguinal hernia repair Right     83-22  y/o - Dr. Ronnald Ramp  . Dilation and curettage of uterus      Social History:  reports that she has been smoking Cigarettes.  She does not have any smokeless tobacco history on file. She reports that she does not drink alcohol or use illicit drugs.  Allergies:  Allergies  Allergen Reactions  . Codeine     "Makes me sick."     (Not in a hospital admission)  Blood pressure 143/92, pulse 75, temperature 98.5 F (36.9 C), temperature source Oral, resp. rate 22, height 5\' 9"  (1.753 m), weight 52.164 kg (115 lb), SpO2 99 %. Physical Exam: General: pleasant, WD/WN AA female who is laying in bed in NAD HEENT: head is normocephalic, atraumatic.  Sclera are noninjected.  PERRL.  Ears and nose without any masses or lesions.  Mouth is pink and moist Heart: regular, rate, and rhythm.  No obvious murmurs, gallops, or rubs noted.  Palpable pedal pulses bilaterally Lungs: CTAB, no wheezes, rhonchi, or rales noted.  Respiratory effort nonlabored Abd: thin, soft, mild distension, mild tenderness, +BS, no masses, hernias, or organomegaly, large low horizontal pelvic scar and right groin scar which is well healed but some what retracted in some areas Rectum:  Sphincter dilatation, intussusception is palpable after the rectal prolapse was easily reduced.  Some minor bleeding noted.  3cm x 2cm ulcerative polyp or mass on the rectal  prolapse with raised edges and flat center MS: all 4 extremities are symmetrical with no cyanosis, clubbing, or edema. Skin: warm and dry with no masses, lesions, or rashes Psych: A&Ox3 with an appropriate affect.   04/08/15, see epic medial for picture on 05/06/15   No results found for this or any previous visit (from the past 48 hour(s)). No results found.    Assessment/Plan Rectal intussusception and prolapse Rectal bleeding Concern for rectal polyp/mass Elevated Cr. 1.32 H/o open RIH repair/open ovarian cyst removal Tobacco use  Plan: 1.  Admit to [facility], obtain EKG,  plain abdominal/chest xray and WS contrast colon enema.  Obtain xrays prior to transfer to [facility] 2.  Consider cardiology consult depending on what xray and ekg shows 3.  NPO, bowel rest, IVF, pain control, antiemetics 4.  Plan for OR tomorrow with Dr. Johney Maine at [facility]. Pre-op antibiotics/prep. 5.  Ambulate and IS 6.  SCD's, hold lovenox/heparin for now. 7.  Labs pending   Nat Christen, [facility] 04/08/2015, 3:53 PM Pager: [phone]

## 2015-04-08 NOTE — ED Notes (Signed)
Pt being picked up by [facility] with all personal belongings documented.

## 2015-04-08 NOTE — ED Notes (Signed)
Notified [facility] for transportation to [facility] 

## 2015-04-08 NOTE — ED Notes (Signed)
Wound Ostomy nurse notified RN that she needs to see pt prior to transfer to [facility]. Will notify Wound RN when pt has returned from xray

## 2015-04-08 NOTE — Consult Note (Signed)
WOC requested for preoperative stoma site marking  Discussed surgical procedure and stoma creation with patient and family.  Explained role of the [place] nurse team.    Answered patient questions.   Examined patient lying, sitting, in order to place the marking in the patient's visual field, away from any creases or abdominal contour issues and within the rectus muscle.  Attempted to mark below the patient's belt line.  Was not able to assess patient standing due to discomfort with rectal prolapse.  Marked for colostomy in the LLQ  _3 cm to the left of the umbilicus and _6___LR below the umbilicus.  Marked for ileostomy in the RLQ  _3___cm to the right of the umbilicus and  _3___ cm below the umbilicus.   Covered mark with thin film transparent dressing to preserve mark until date of surgery, pt to transport from [facility] ED to [facility] for surgery tomorrow.   [place] team will follow up with patient after surgery for continue ostomy care and teaching.  Isabel Ardila [place] RN,CWOCN [phone]

## 2015-04-08 NOTE — ED Notes (Signed)
Gave report to [facility]. Pt ready for transport

## 2015-04-08 NOTE — ED Provider Notes (Signed)
CSN: [id number]     Arrival date & time 04/08/15  1326 History   First MD Initiated Contact with Patient 04/08/15 1336     Chief Complaint  Patient presents with  . Rectal Problems     (Consider location/radiation/quality/duration/timing/severity/associated sxs/prior Treatment) HPI Comments: Patient is a 64 year old female who presents with rectal pain for the past 6 months with associated intermittent rectal bleeding. The pain is sharp and severe without radiation. She reports being unable to have a bowel movement and is only able to pass some mucous and blood. She was seen in the ED 3 days ago by General Surgery and was prescribed pain medication but is unable to afford them. Any palpation or pressure of the area worsens the pain. No alleviating factors.    Past Medical History  Diagnosis Date  . Ovarian cyst   . Asthma    History reviewed. No pertinent past surgical history. History reviewed. No pertinent family history. Social History  Substance Use Topics  . Smoking status: Current Every Day Smoker    Types: Cigarettes  . Smokeless tobacco: None  . Alcohol Use: No   OB History    No data available     Review of Systems  Constitutional: Negative for fever, chills and fatigue.  HENT: Negative for trouble swallowing.   Eyes: Negative for visual disturbance.  Respiratory: Negative for shortness of breath.   Cardiovascular: Negative for chest pain and palpitations.  Gastrointestinal: Positive for rectal pain. Negative for nausea, vomiting, abdominal pain and diarrhea.  Genitourinary: Negative for dysuria and difficulty urinating.  Musculoskeletal: Negative for arthralgias and neck pain.  Skin: Negative for color change.  Neurological: Negative for dizziness and weakness.  Psychiatric/Behavioral: Negative for dysphoric mood.      Allergies  Codeine  Home Medications   Prior to Admission medications   Medication Sig Start Date End Date Taking? Authorizing Provider   docusate sodium (COLACE) 100 MG capsule Take 1 capsule (100 mg total) by mouth every 12 (twelve) hours. 04/05/15   Evelina Bucy, MD  ibuprofen (ADVIL,MOTRIN) 200 MG tablet Take 400 mg by mouth every 6 (six) hours as needed for moderate pain.    Historical Provider, MD  psyllium (METAMUCIL) 0.52 G capsule Take 1 capsule (0.52 g total) by mouth daily. 04/05/15   Evelina Bucy, MD   BP 153/85 mmHg  Pulse 75  Temp(Src) 98.5 F (36.9 C) (Oral)  Resp 22  Ht 5\' 9"  (1.753 m)  Wt 115 lb (52.164 kg)  BMI 16.97 kg/m2  SpO2 99% Physical Exam  Constitutional: She is oriented to person, place, and time. She appears well-developed and well-nourished. No distress.  HENT:  Head: Normocephalic and atraumatic.  Eyes: Conjunctivae and EOM are normal.  Neck: Normal range of motion.  Cardiovascular: Normal rate and regular rhythm.  Exam reveals no gallop and no friction rub.   No murmur heard. Pulmonary/Chest: Effort normal and breath sounds normal. She has no wheezes. She has no rales. She exhibits no tenderness.  Abdominal: Soft. She exhibits no distension. There is no tenderness. There is no rebound.  Genitourinary:  Rectal prolapse that is reducible but will not maintain reduction. Tender with palpation and manipulation.   Musculoskeletal: Normal range of motion.  Neurological: She is alert and oriented to person, place, and time. Coordination normal.  Speech is goal-oriented. Moves limbs without ataxia.   Skin: Skin is warm and dry.  Psychiatric: She has a normal mood and affect. Her behavior is normal.  Nursing  note and vitals reviewed.   ED Course  Procedures (including critical care time) Labs Review Labs Reviewed - No data to display  Imaging Review No results found. I have personally reviewed and evaluated these images and lab results as part of my medical decision-making.   EKG Interpretation None      MDM   Final diagnoses:  Rectal prolapse  Intussusception of rectum     2:27 PM Will consult surgery.   3:24 PM Dr. Johney Maine saw the patient. Patient will be transferred to [facility] for surgery tomorrow.   Alvina Chou, PA-C 04/08/15 [place], MD 04/09/15 [phone]

## 2015-04-08 NOTE — Progress Notes (Addendum)
See earlier history and physical.  Patient denies much pain after only one dose of morphine.  No guarding or peritonitis.  No evidence of necrotic or ischemic mucosa in the chronically prolapsed rectosigmoid colon.  Patient with chronically intussuscepted rectosigmoid colon.  Small polyp felt.  Doubt that would be enough of a lead point. I am concerned that there could be something more proximal that I cannot be palpate at this point.  Not able to be reduced.  We will try barium enema to see if the intussuception will reduce and also rule out more proximal lesions.  Patient would benefit from resection and rectopexy for this chronically prolapsing intussuscepted colorectum.  We will admit and hydrate.  Plan to do tomorrow morning since she is not toxic right now.  Reasonable for a minimally invasive approach and she is not in shock massively distended.  Hopefully can avoid temporary colostomy.  We will see how things look.  The anatomy & physiology of the digestive tract was discussed.  The pathophysiology of rectal prolapse was discussed.  Natural history risks without surgery was discussed.   I feel the risks of no intervention will lead to serious problems that outweigh the operative risks; therefore, I recommended surgery to treat the pathology.  Possible need for sigmoid colectomy to remove redundant colon was discussed.  Pexy by suture and probable mesh reinforcement was discussed as well.  Laparoscopic & open techniques were discussed.   Risks such as bleeding, infection, abscess, leak, reoperation, possible ostomy, hernia, stroke, heart attack, death, and other risks were discussed.   I noted a good likelihood this will help address the problem.  Goals of post-operative recovery were discussed as well.  We will work to minimize complications.  An educational handout on the technique was given as well.  Questions were answered.  The patient expresses understanding & wishes to proceed with  surgery.  Adin Hector, M.D., F.A.C.S. Gastrointestinal and Minimally Invasive Surgery [facility] [address] [place],  [id number] [phone] Main / Paging

## 2015-04-09 ENCOUNTER — Encounter (HOSPITAL_COMMUNITY): Payer: Self-pay | Admitting: Anesthesiology

## 2015-04-09 ENCOUNTER — Inpatient Hospital Stay (HOSPITAL_COMMUNITY): Payer: Medicare Other | Admitting: Anesthesiology

## 2015-04-09 ENCOUNTER — Encounter (HOSPITAL_COMMUNITY)
Admission: EM | Disposition: A | Discharge status: Home Health | Payer: Self-pay | Source: Home / Self Care | Attending: Surgery

## 2015-04-09 DIAGNOSIS — Z720 Tobacco use: Secondary | ICD-10-CM | POA: Diagnosis present

## 2015-04-09 DIAGNOSIS — K561 Intussusception: Secondary | ICD-10-CM | POA: Diagnosis present

## 2015-04-09 DIAGNOSIS — F40240 Claustrophobia: Secondary | ICD-10-CM

## 2015-04-09 DIAGNOSIS — K623 Rectal prolapse: Secondary | ICD-10-CM | POA: Diagnosis present

## 2015-04-09 HISTORY — PX: LAPAROSCOPIC LOW ANTERIOR RESECTION: SHX5904

## 2015-04-09 HISTORY — PX: RECTOPEXY: SHX5700

## 2015-04-09 LAB — TYPE AND SCREEN
ABO/RH(D): B POS
ANTIBODY SCREEN: NEGATIVE

## 2015-04-09 LAB — ABO/RH: ABO/RH(D): B POS

## 2015-04-09 SURGERY — RECTOPEXY
Anesthesia: General

## 2015-04-09 MED ORDER — CISATRACURIUM BESYLATE 20 MG/10ML IV SOLN
INTRAVENOUS | Status: AC
  Filled 2015-04-09: qty 10

## 2015-04-09 MED ORDER — PROPOFOL 10 MG/ML IV BOLUS
INTRAVENOUS | Status: DC | PRN
  Administered 2015-04-09: 110 mg via INTRAVENOUS

## 2015-04-09 MED ORDER — LIDOCAINE HCL (CARDIAC) 20 MG/ML IV SOLN
INTRAVENOUS | Status: AC
  Filled 2015-04-09: qty 5

## 2015-04-09 MED ORDER — IBUPROFEN 400 MG PO TABS
400.0000 mg | ORAL_TABLET | Freq: Four times a day (QID) | ORAL | Status: DC | PRN
  Administered 2015-04-13: 400 mg via ORAL
  Filled 2015-04-09 (×2): qty 1

## 2015-04-09 MED ORDER — PROPOFOL 10 MG/ML IV BOLUS
INTRAVENOUS | Status: AC
  Filled 2015-04-09: qty 20

## 2015-04-09 MED ORDER — ALBUTEROL SULFATE (2.5 MG/3ML) 0.083% IN NEBU
3.0000 mL | INHALATION_SOLUTION | Freq: Four times a day (QID) | RESPIRATORY_TRACT | Status: DC | PRN
  Administered 2015-04-10: 3 mL via RESPIRATORY_TRACT
  Filled 2015-04-09: qty 3

## 2015-04-09 MED ORDER — SUFENTANIL CITRATE 50 MCG/ML IV SOLN
INTRAVENOUS | Status: AC
  Filled 2015-04-09: qty 1

## 2015-04-09 MED ORDER — DIPHENHYDRAMINE HCL 12.5 MG/5ML PO ELIX
12.5000 mg | ORAL_SOLUTION | Freq: Four times a day (QID) | ORAL | Status: DC | PRN
  Administered 2015-04-12 – 2015-04-13 (×3): 12.5 mg via ORAL
  Filled 2015-04-09 (×4): qty 5

## 2015-04-09 MED ORDER — DEXAMETHASONE SODIUM PHOSPHATE 10 MG/ML IJ SOLN
INTRAMUSCULAR | Status: DC | PRN
  Administered 2015-04-09: 5 mg via INTRAVENOUS

## 2015-04-09 MED ORDER — BUPIVACAINE LIPOSOME 1.3 % IJ SUSP: 60.0000 mL | Freq: Once | INTRAMUSCULAR | Status: DC

## 2015-04-09 MED ORDER — ONDANSETRON HCL 4 MG/2ML IJ SOLN
4.0000 mg | Freq: Four times a day (QID) | INTRAMUSCULAR | Status: DC | PRN
  Administered 2015-04-11: 4 mg via INTRAVENOUS
  Filled 2015-04-09: qty 2

## 2015-04-09 MED ORDER — SACCHAROMYCES BOULARDII 250 MG PO CAPS
250.0000 mg | ORAL_CAPSULE | Freq: Two times a day (BID) | ORAL | Status: DC
  Administered 2015-04-09 – 2015-04-13 (×8): 250 mg via ORAL
  Filled 2015-04-09 (×11): qty 1

## 2015-04-09 MED ORDER — MEPERIDINE HCL 50 MG/ML IJ SOLN: 6.2500 mg | INTRAMUSCULAR | Status: DC | PRN

## 2015-04-09 MED ORDER — SODIUM CHLORIDE 0.9 % IJ SOLN
INTRAMUSCULAR | Status: AC
  Filled 2015-04-09: qty 100

## 2015-04-09 MED ORDER — DEXAMETHASONE SODIUM PHOSPHATE 10 MG/ML IJ SOLN
INTRAMUSCULAR | Status: AC
  Filled 2015-04-09: qty 1

## 2015-04-09 MED ORDER — SUCCINYLCHOLINE CHLORIDE 20 MG/ML IJ SOLN
INTRAMUSCULAR | Status: DC | PRN
  Administered 2015-04-09: 6 mg via INTRAVENOUS

## 2015-04-09 MED ORDER — HYDROMORPHONE HCL 1 MG/ML IJ SOLN
INTRAMUSCULAR | Status: AC
  Filled 2015-04-09: qty 1

## 2015-04-09 MED ORDER — DEXTROSE 5 % IV SOLN
2.0000 g | Freq: Two times a day (BID) | INTRAVENOUS | Status: AC
  Administered 2015-04-09: 2 g via INTRAVENOUS
  Filled 2015-04-09: qty 2

## 2015-04-09 MED ORDER — SUFENTANIL CITRATE 50 MCG/ML IV SOLN
INTRAVENOUS | Status: DC | PRN
  Administered 2015-04-09: 10 ug via INTRAVENOUS
  Administered 2015-04-09: 20 ug via INTRAVENOUS
  Administered 2015-04-09: 10 ug via INTRAVENOUS
  Administered 2015-04-09 (×2): 5 ug via INTRAVENOUS

## 2015-04-09 MED ORDER — ONDANSETRON HCL 4 MG/2ML IJ SOLN
INTRAMUSCULAR | Status: DC | PRN
  Administered 2015-04-09: 4 mg via INTRAVENOUS

## 2015-04-09 MED ORDER — ALVIMOPAN 12 MG PO CAPS
12.0000 mg | ORAL_CAPSULE | Freq: Two times a day (BID) | ORAL | Status: DC
  Administered 2015-04-10: 12 mg via ORAL
  Filled 2015-04-09 (×3): qty 1

## 2015-04-09 MED ORDER — ONDANSETRON HCL 4 MG PO TABS
4.0000 mg | ORAL_TABLET | Freq: Four times a day (QID) | ORAL | Status: DC | PRN
  Filled 2015-04-09: qty 1

## 2015-04-09 MED ORDER — GLYCOPYRROLATE 0.2 MG/ML IJ SOLN
INTRAMUSCULAR | Status: AC
  Filled 2015-04-09: qty 3

## 2015-04-09 MED ORDER — NEOSTIGMINE METHYLSULFATE 10 MG/10ML IV SOLN
INTRAVENOUS | Status: DC | PRN
  Administered 2015-04-09: 4 mg via INTRAVENOUS

## 2015-04-09 MED ORDER — HYDROMORPHONE HCL 1 MG/ML IJ SOLN
0.5000 mg | INTRAMUSCULAR | Status: DC | PRN
  Administered 2015-04-10 – 2015-04-12 (×6): 1 mg via INTRAVENOUS
  Filled 2015-04-09 (×7): qty 1

## 2015-04-09 MED ORDER — GLYCOPYRROLATE 0.2 MG/ML IJ SOLN
INTRAMUSCULAR | Status: DC | PRN
  Administered 2015-04-09: 0.6 mg via INTRAVENOUS

## 2015-04-09 MED ORDER — LACTATED RINGERS IR SOLN
Status: DC | PRN
  Administered 2015-04-09: 1000 mL

## 2015-04-09 MED ORDER — METOPROLOL TARTRATE 1 MG/ML IV SOLN
5.0000 mg | Freq: Four times a day (QID) | INTRAVENOUS | Status: DC
  Administered 2015-04-09 – 2015-04-13 (×6): 5 mg via INTRAVENOUS
  Filled 2015-04-09 (×18): qty 5

## 2015-04-09 MED ORDER — HYDROMORPHONE HCL 1 MG/ML IJ SOLN
0.2500 mg | INTRAMUSCULAR | Status: DC | PRN
  Administered 2015-04-09 (×2): 0.5 mg via INTRAVENOUS

## 2015-04-09 MED ORDER — ACETAMINOPHEN 500 MG PO TABS
1000.0000 mg | ORAL_TABLET | Freq: Three times a day (TID) | ORAL | Status: DC
  Administered 2015-04-09 – 2015-04-13 (×11): 1000 mg via ORAL
  Filled 2015-04-09 (×18): qty 2

## 2015-04-09 MED ORDER — LACTATED RINGERS IV SOLN: INTRAVENOUS | Status: DC

## 2015-04-09 MED ORDER — SODIUM CHLORIDE 0.9 % IJ SOLN
INTRAMUSCULAR | Status: AC
  Filled 2015-04-09: qty 10

## 2015-04-09 MED ORDER — LACTATED RINGERS IV SOLN
INTRAVENOUS | Status: DC
  Administered 2015-04-09: 16:00:00 via INTRAVENOUS
  Administered 2015-04-09: 1000 mL via INTRAVENOUS

## 2015-04-09 MED ORDER — LACTATED RINGERS IV SOLN
INTRAVENOUS | Status: DC
Start: 2015-04-09 — End: 2015-04-09
  Administered 2015-04-09: 1000 mL via INTRAVENOUS
  Administered 2015-04-09: 14:00:00 via INTRAVENOUS

## 2015-04-09 MED ORDER — ALBUTEROL SULFATE HFA 108 (90 BASE) MCG/ACT IN AERS
INHALATION_SPRAY | RESPIRATORY_TRACT | Status: AC
  Filled 2015-04-09: qty 6.7

## 2015-04-09 MED ORDER — ALUM & MAG HYDROXIDE-SIMETH 200-200-20 MG/5ML PO SUSP
30.0000 mL | Freq: Four times a day (QID) | ORAL | Status: DC | PRN
  Filled 2015-04-09: qty 30

## 2015-04-09 MED ORDER — BUPIVACAINE LIPOSOME 1.3 % IJ SUSP
INTRAMUSCULAR | Status: DC | PRN
  Administered 2015-04-09: 20 mL

## 2015-04-09 MED ORDER — CEFOTETAN DISODIUM-DEXTROSE 2-2.08 GM-% IV SOLR
INTRAVENOUS | Status: AC
  Filled 2015-04-09: qty 50

## 2015-04-09 MED ORDER — ONDANSETRON HCL 4 MG/2ML IJ SOLN
INTRAMUSCULAR | Status: AC
  Filled 2015-04-09: qty 2

## 2015-04-09 MED ORDER — PROMETHAZINE HCL 25 MG/ML IJ SOLN: 6.2500 mg | INTRAMUSCULAR | Status: DC | PRN

## 2015-04-09 MED ORDER — DIPHENHYDRAMINE HCL 50 MG/ML IJ SOLN: 12.5000 mg | Freq: Four times a day (QID) | INTRAMUSCULAR | Status: DC | PRN

## 2015-04-09 MED ORDER — ALBUTEROL SULFATE HFA 108 (90 BASE) MCG/ACT IN AERS
INHALATION_SPRAY | RESPIRATORY_TRACT | Status: DC | PRN
  Administered 2015-04-09: 2 via RESPIRATORY_TRACT
  Administered 2015-04-09: 7 via RESPIRATORY_TRACT

## 2015-04-09 MED ORDER — BUPIVACAINE-EPINEPHRINE 0.25% -1:200000 IJ SOLN
INTRAMUSCULAR | Status: DC | PRN
  Administered 2015-04-09: 15 mL

## 2015-04-09 MED ORDER — LIDOCAINE HCL (CARDIAC) 20 MG/ML IV SOLN
INTRAVENOUS | Status: DC | PRN
  Administered 2015-04-09: 50 mg via INTRAVENOUS

## 2015-04-09 MED ORDER — BUPIVACAINE-EPINEPHRINE 0.25% -1:200000 IJ SOLN
INTRAMUSCULAR | Status: AC
  Filled 2015-04-09: qty 1

## 2015-04-09 MED ORDER — CISATRACURIUM BESYLATE (PF) 10 MG/5ML IV SOLN
INTRAVENOUS | Status: DC | PRN
  Administered 2015-04-09: 10 mg via INTRAVENOUS
  Administered 2015-04-09: 4 mg via INTRAVENOUS
  Administered 2015-04-09: 2 mg via INTRAVENOUS
  Administered 2015-04-09: 4 mg via INTRAVENOUS

## 2015-04-09 SURGICAL SUPPLY — 98 items
APPLIER CLIP 5 13 M/L LIGAMAX5 (MISCELLANEOUS)
APPLIER CLIP ROT 10 11.4 M/L (STAPLE)
APR CLP MED LRG 11.4X10 (STAPLE)
APR CLP MED LRG 5 ANG JAW (MISCELLANEOUS)
BLADE EXTENDED COATED 6.5IN (ELECTRODE) ×2 IMPLANT
BLADE HEX COATED 2.75 (ELECTRODE) ×4 IMPLANT
BLADE SURG 15 STRL LF DISP TIS (BLADE) ×1 IMPLANT
BLADE SURG 15 STRL SS (BLADE) ×2
BLADE SURG SZ10 CARB STEEL (BLADE) ×2 IMPLANT
BRIEF STRETCH FOR OB PAD LRG (UNDERPADS AND DIAPERS) IMPLANT
CABLE HIGH FREQUENCY MONO STRZ (ELECTRODE) ×2 IMPLANT
CATH KIT ON-Q SILVERSOAK 7.5 (CATHETERS) IMPLANT
CATH KIT ON-Q SILVERSOAK 7.5IN (CATHETERS) IMPLANT
CELLS DAT CNTRL 66122 CELL SVR (MISCELLANEOUS) IMPLANT
CHLORAPREP W/TINT 26ML (MISCELLANEOUS) ×2 IMPLANT
CLIP APPLIE 5 13 M/L LIGAMAX5 (MISCELLANEOUS) IMPLANT
CLIP APPLIE ROT 10 11.4 M/L (STAPLE) IMPLANT
COUNTER NEEDLE 20 DBL MAG RED (NEEDLE) IMPLANT
COVER MAYO STAND STRL (DRAPES) ×4 IMPLANT
COVER SURGICAL LIGHT HANDLE (MISCELLANEOUS) ×2 IMPLANT
DECANTER SPIKE VIAL GLASS SM (MISCELLANEOUS) ×2 IMPLANT
DRAIN CHANNEL 19F RND (DRAIN) IMPLANT
DRAPE LAPAROSCOPIC ABDOMINAL (DRAPES) ×2 IMPLANT
DRAPE SHEET LG 3/4 BI-LAMINATE (DRAPES) ×2 IMPLANT
DRAPE UTILITY XL STRL (DRAPES) ×4 IMPLANT
DRAPE WARM FLUID 44X44 (DRAPE) ×2 IMPLANT
DRSG OPSITE POSTOP 4X10 (GAUZE/BANDAGES/DRESSINGS) IMPLANT
DRSG OPSITE POSTOP 4X6 (GAUZE/BANDAGES/DRESSINGS) ×1 IMPLANT
DRSG OPSITE POSTOP 4X8 (GAUZE/BANDAGES/DRESSINGS) IMPLANT
DRSG PAD ABDOMINAL 8X10 ST (GAUZE/BANDAGES/DRESSINGS) IMPLANT
DRSG TEGADERM 2-3/8X2-3/4 SM (GAUZE/BANDAGES/DRESSINGS) ×4 IMPLANT
DRSG TEGADERM 4X4.75 (GAUZE/BANDAGES/DRESSINGS) ×2 IMPLANT
ELECT PENCIL ROCKER SW 15FT (MISCELLANEOUS) ×4 IMPLANT
ELECT REM PT RETURN 9FT ADLT (ELECTROSURGICAL) ×2
ELECTRODE REM PT RTRN 9FT ADLT (ELECTROSURGICAL) ×1 IMPLANT
EVACUATOR SILICONE 100CC (DRAIN) IMPLANT
GAUZE SPONGE 2X2 8PLY STRL LF (GAUZE/BANDAGES/DRESSINGS) IMPLANT
GAUZE SPONGE 4X4 12PLY STRL (GAUZE/BANDAGES/DRESSINGS) ×1 IMPLANT
GAUZE SPONGE 4X4 16PLY XRAY LF (GAUZE/BANDAGES/DRESSINGS) ×1 IMPLANT
GLOVE ECLIPSE 8.0 STRL XLNG CF (GLOVE) ×10 IMPLANT
GLOVE INDICATOR 8.0 STRL GRN (GLOVE) ×4 IMPLANT
GOWN STRL REUS W/TWL XL LVL3 (GOWN DISPOSABLE) ×8 IMPLANT
KIT BASIN OR (CUSTOM PROCEDURE TRAY) ×1 IMPLANT
LEGGING LITHOTOMY PAIR STRL (DRAPES) ×2 IMPLANT
LUBRICANT JELLY K Y 4OZ (MISCELLANEOUS) ×2 IMPLANT
NEEDLE HYPO 22GX1.5 SAFETY (NEEDLE) ×2 IMPLANT
NS IRRIG 1000ML POUR BTL (IV SOLUTION) ×2 IMPLANT
PACK GENERAL/GYN (CUSTOM PROCEDURE TRAY) IMPLANT
PACK LITHOTOMY IV (CUSTOM PROCEDURE TRAY) ×1 IMPLANT
PIN HEMORRHAGE OCCLUDER (PIN)
PIN HMRHG STD 7X10XAPL SRR (PIN) IMPLANT
PORT LAP GEL ALEXIS MED 5-9CM (MISCELLANEOUS) ×1 IMPLANT
RETRACTOR LONE STAR DISPOSABLE (INSTRUMENTS) IMPLANT
RETRACTOR STAY HOOK 5MM (MISCELLANEOUS) IMPLANT
RETRACTOR WND ALEXIS 18 MED (MISCELLANEOUS) IMPLANT
RTRCTR WOUND ALEXIS 18CM MED (MISCELLANEOUS)
SCISSORS LAP 5X35 DISP (ENDOMECHANICALS) ×2 IMPLANT
SEALER TISSUE G2 STRG ARTC 35C (ENDOMECHANICALS) ×1 IMPLANT
SET IRRIG TUBING LAPAROSCOPIC (IRRIGATION / IRRIGATOR) ×2 IMPLANT
SHEARS HARMONIC 9CM CVD (BLADE) IMPLANT
SLEEVE ADV FIXATION 5X100MM (TROCAR) ×1 IMPLANT
SLEEVE XCEL OPT CAN 5 100 (ENDOMECHANICALS) ×3 IMPLANT
SPONGE GAUZE 2X2 STER 10/PKG (GAUZE/BANDAGES/DRESSINGS) ×1
SPONGE LAP 18X18 X RAY DECT (DISPOSABLE) ×2 IMPLANT
STAPLER CIRC ILS CVD 33MM 37CM (STAPLE) ×1 IMPLANT
STAPLER CUT CVD 40MM BLUE (STAPLE) ×1 IMPLANT
STAPLER VISISTAT 35W (STAPLE) ×1 IMPLANT
SUCTION POOLE TIP (SUCTIONS) ×2 IMPLANT
SUT CHROMIC 2 0 SH (SUTURE) IMPLANT
SUT CHROMIC 3 0 SH 27 (SUTURE) IMPLANT
SUT MNCRL AB 4-0 PS2 18 (SUTURE) ×2 IMPLANT
SUT PDS AB 1 CTX 36 (SUTURE) ×2 IMPLANT
SUT PDS AB 1 TP1 96 (SUTURE) IMPLANT
SUT PDS AB 2-0 CT2 27 (SUTURE) ×2 IMPLANT
SUT PROLENE 0 CT 2 (SUTURE) ×1 IMPLANT
SUT SILK 2 0 (SUTURE) ×2
SUT SILK 2 0 SH CR/8 (SUTURE) ×2 IMPLANT
SUT SILK 2-0 18XBRD TIE 12 (SUTURE) ×1 IMPLANT
SUT SILK 3 0 (SUTURE) ×2
SUT SILK 3 0 SH CR/8 (SUTURE) ×2 IMPLANT
SUT SILK 3-0 18XBRD TIE 12 (SUTURE) ×1 IMPLANT
SUT VIC AB 2-0 SH 18 (SUTURE) ×1 IMPLANT
SYR 20CC LL (SYRINGE) ×2 IMPLANT
SYR BULB IRRIGATION 50ML (SYRINGE) ×2 IMPLANT
SYS LAPSCP GELPORT 120MM (MISCELLANEOUS)
SYSTEM LAPSCP GELPORT 120MM (MISCELLANEOUS) IMPLANT
TAPE UMBILICAL COTTON 1/8X30 (MISCELLANEOUS) IMPLANT
TOWEL OR 17X26 10 PK STRL BLUE (TOWEL DISPOSABLE) ×4 IMPLANT
TOWEL OR NON WOVEN STRL DISP B (DISPOSABLE) ×4 IMPLANT
TRAY FOLEY W/METER SILVER 14FR (SET/KITS/TRAYS/PACK) ×2 IMPLANT
TRAY FOLEY W/METER SILVER 16FR (SET/KITS/TRAYS/PACK) ×1 IMPLANT
TRAY LAPAROSCOPIC (CUSTOM PROCEDURE TRAY) ×2 IMPLANT
TROCAR BLADELESS OPT 5 100 (ENDOMECHANICALS) ×2 IMPLANT
TROCAR HASSON 5MM (TROCAR) ×1 IMPLANT
TROCAR XCEL NON-BLD 11X100MML (ENDOMECHANICALS) ×1 IMPLANT
TUBING FILTER THERMOFLATOR (ELECTROSURGICAL) ×2 IMPLANT
TUNNELER SHEATH ON-Q 16GX12 DP (PAIN MANAGEMENT) IMPLANT
YANKAUER SUCT BULB TIP 10FT TU (MISCELLANEOUS) ×4 IMPLANT

## 2015-04-09 NOTE — Transfer of Care (Signed)
Immediate Anesthesia Transfer of Care Note  Patient: Carol Dominguez  Procedure(s) Performed: Procedure(s): RECTOPEXY (N/A) LAPAROSCOPIC LOW ANTERIOR RESECTION (N/A)  Patient Location: PACU  Anesthesia Type:General  Level of Consciousness: awake, alert , oriented and patient cooperative  Airway & Oxygen Therapy: Patient Spontanous Breathing and Patient connected to face mask oxygen  Post-op Assessment: Report given to RN and Post -op Vital signs reviewed and stable  Post vital signs: Reviewed and stable  Last Vitals:  Filed Vitals:   04/09/15 1059  BP: 141/76  Pulse: 66  Temp: 36.7 C  Resp: 18    Complications: No apparent anesthesia complications

## 2015-04-09 NOTE — Anesthesia Preprocedure Evaluation (Addendum)
Anesthesia Evaluation  Patient identified by MRN, date of birth, ID band Patient awake    Reviewed: Allergy & Precautions, NPO status , Patient's Chart, lab work & pertinent test results  Airway Mallampati: II  TM Distance: >3 FB Neck ROM: Full    Dental  (+) Missing, Chipped   Pulmonary Current Smoker,  breath sounds clear to auscultation        Cardiovascular negative cardio ROS  Rhythm:Regular Rate:Normal     Neuro/Psych negative neurological ROS  negative psych ROS   GI/Hepatic negative GI ROS, Neg liver ROS,   Endo/Other  negative endocrine ROS  Renal/GU negative Renal ROS  negative genitourinary   Musculoskeletal negative musculoskeletal ROS (+)   Abdominal   Peds negative pediatric ROS (+)  Hematology negative hematology ROS (+)   Anesthesia Other Findings   Reproductive/Obstetrics negative OB ROS                            Lab Results  Component Value Date   WBC 6.2 04/08/2015   HGB 12.0 04/08/2015   HCT 35.9* 04/08/2015   MCV 89.1 04/08/2015   PLT 273 04/08/2015   Lab Results  Component Value Date   CREATININE 1.08* 04/08/2015   BUN 7 04/08/2015   NA 141 04/08/2015   K 3.5 04/08/2015   CL 107 04/08/2015   CO2 28 04/08/2015   Lab Results  Component Value Date   INR 1.05 04/05/2015   04/08/2015:EKG: normal sinus rhythm.   Anesthesia Physical Anesthesia Plan  ASA: II  Anesthesia Plan: General   Post-op Pain Management:    Induction: Intravenous  Airway Management Planned: Oral ETT  Additional Equipment:   Intra-op Plan:   Post-operative Plan:   Informed Consent: I have reviewed the patients History and Physical, chart, labs and discussed the procedure including the risks, benefits and alternatives for the proposed anesthesia with the patient or authorized representative who has indicated his/her understanding and acceptance.   Dental advisory  given  Plan Discussed with: CRNA  Anesthesia Plan Comments:         Anesthesia Quick Evaluation

## 2015-04-09 NOTE — Progress Notes (Signed)
[address] [place], [place] [address] Phone: [phone] FAX: [place] [phone] 15-Nov-1950   Problem List:   Principal Problem:   Intussusception of rectum with colon obstruction Active Problems:   Rectal prolapse   Tobacco use   Day of Surgery   Assessment  Patient with chronically intussuscepted rectosigmoid colon.  Small polyp felt.  Doubt that would be enough of a lead point.  I am concerned that there could be something more proximal that I cannot be palpate at this point.  Not able to be reduced.  Plan:    Radiology not able to perform enema to see if the intussuception will reduce and also rule out more proximal lesions.  Patient would benefit from resection and rectopexy for this chronically prolapsing intussuscepted colorectum.  We will admit and hydrate.  Plan to do tomorrow morning since she is not toxic right now.  Reasonable for a minimally invasive approach and she is not in shock normassively distended.  Hopefully can avoid temporary colostomy.  We will see how things look.  The anatomy & physiology of the digestive tract was discussed.  The pathophysiology of rectal prolapse was discussed.  Natural history risks without surgery was discussed.   I feel the risks of no intervention will lead to serious problems that outweigh the operative risks; therefore, I recommended surgery to treat the pathology.  Possible need for sigmoid colectomy to remove redundant colon was discussed.  Pexy by suture and probable mesh reinforcement was discussed as well.  Laparoscopic & open techniques were discussed.   Risks such as bleeding, infection, abscess, leak, reoperation, possible ostomy, hernia, stroke, heart attack, death, and other risks were discussed.   I noted a good likelihood this will help address the problem.  Goals of post-operative recovery were discussed as well.  We will work to  minimize complications.  An educational handout on the technique was given as well.  Questions were answered.  The patient expresses understanding & wishes to proceed with surgery.  Adin Hector, M.D., F.A.C.S. Gastrointestinal and Minimally Invasive Surgery [facility] [address] [place], [place] [address] [phone] Main / Paging    04/09/2015  Subjective:  No events Pain controlled  Objective:  Vital signs:  Filed Vitals:   04/08/15 2231 04/09/15 0615 04/09/15 1000 04/09/15 1059  BP: 155/92 139/73  141/76  Pulse: 61 66  66  Temp: 98.1 F (36.7 C) 98.5 F (36.9 C) 98 F (36.7 C) 98 F (36.7 C)  TempSrc: Oral Oral    Resp: _0 Height:      Weight:      SpO2: 99% 99%  98%    Last BM Date: 03/22/15  Intake/Output   Yesterday:  09/01 0701 - 09/02 0700 In: 240 [P.O.:240] Out: 400 [Urine:400] This shift:     Bowel function:  Flatus: y  BM: n  Drain: n/a  Physical Exam:  General: Pt awake/alert/oriented x4 in no acute distress Eyes: PERRL, normal EOM.  Sclera clear.  No icterus Neuro: CN II-XII intact w/o focal sensory/motor deficits. Lymph: No head/neck/groin lymphadenopathy Psych:  No delerium/psychosis/paranoia HENT: Normocephalic, Mucus membranes moist.  No thrush Neck: Supple, No tracheal deviation Chest: No chest wall pain w good excursion CV:  Pulses intact.  Regular rhythm MS: Normal AROM mjr joints.  No obvious deformity Abdomen: Soft.  Nondistended.  Mildly tender at suprapubic area.  No evidence of peritonitis.  No incarcerated hernias. Ext:  SCDs BLE.  No mjr edema.  No cyanosis Skin: No petechiae / purpura  Results:   Labs: Results for orders placed or performed during the hospital encounter of 04/08/15 (from the past 48 hour(s))  Comprehensive metabolic panel     Status: Abnormal   Collection Time: 04/08/15  5:26 PM  Result Value Ref Range   Sodium 141 135 - 145 mmol/L   Potassium  3.5 3.5 - 5.1 mmol/L    Comment: SLIGHT HEMOLYSIS   Chloride 107 101 - 111 mmol/L   CO2 28 22 - 32 mmol/L   Glucose, Bld 100 (H) 65 - 99 mg/dL   BUN 7 6 - 20 mg/dL   Creatinine, Ser 1.08 (H) 0.44 - 1.00 mg/dL   Calcium 8.3 (L) 8.9 - 10.3 mg/dL   Total Protein 5.3 (L) 6.5 - 8.1 g/dL   Albumin 2.7 (L) 3.5 - 5.0 g/dL   AST 38 15 - 41 U/L   ALT 18 14 - 54 U/L   Alkaline Phosphatase 46 38 - 126 U/L   Total Bilirubin 0.9 0.3 - 1.2 mg/dL   GFR calc non Af Amer 54 (L) >60 mL/min   GFR calc Af Amer >60 >60 mL/min    Comment: (NOTE) The eGFR has been calculated using the CKD EPI equation. This calculation has not been validated in all clinical situations. eGFR's persistently <60 mL/min signify possible Chronic Kidney Disease.    Anion gap 6 5 - 15  CBC     Status: Abnormal   Collection Time: 04/08/15  5:26 PM  Result Value Ref Range   WBC 6.2 4.0 - 10.5 K/uL   RBC 4.03 3.87 - 5.11 MIL/uL   Hemoglobin 12.0 12.0 - 15.0 g/dL   HCT 35.9 (L) 36.0 - 46.0 %   MCV 89.1 78.0 - 100.0 fL   MCH 29.8 26.0 - 34.0 pg   MCHC 33.4 30.0 - 36.0 g/dL   RDW 13.4 11.5 - 15.5 %   Platelets 273 150 - 400 K/uL  Surgical pcr screen     Status: None   Collection Time: 04/08/15  9:26 PM  Result Value Ref Range   MRSA, PCR NEGATIVE NEGATIVE   Staphylococcus aureus NEGATIVE NEGATIVE    Comment:        The Xpert SA Assay (FDA approved for NASAL specimens in patients over 9 years of age), is one component of a comprehensive surveillance program.  Test performance has been validated by [facility] for patients greater than or equal to 30 year old. It is not intended to diagnose infection nor to guide or monitor treatment.     Imaging / Studies: Dg Abd Acute W/chest  04/08/2015   ADDENDUM REPORT: 04/08/2015 16:57  ADDENDUM: Case was discussed by telephone with Dr. Johney Maine. Given the patient's rectal prolapse, progressive colonic distention and previous CT findings, it was not felt that a water-soluble  enema could be performed for the requested indication. This was discussed with the patient as well.   Electronically Signed   By: Richardean Sale M.D.   On: 04/08/2015 16:57   04/08/2015   CLINICAL DATA:  Colocolonic intussusception on CT. Recurrent rectal prolapse.  EXAM: DG ABDOMEN ACUTE W/ 1V CHEST  COMPARISON:  CT 04/05/2015.  FINDINGS: The heart size and mediastinal contours are normal. The lungs are clear. There is no pleural effusion or pneumothorax. EKG snaps overlie the chest.  Compared with the recent CT, there is  progressive gaseous distention of the colon. Enteric contrast from the CT is present within the colon. Clinically, the patient has rectal prolapse, and these findings are consistent with progressive distal colonic obstruction. There is no evidence of pneumatosis, free intraperitoneal air or extravasated enteric contrast. Scattered vascular calcifications are noted.  IMPRESSION: Progressive colonic distention suspicious for distal colon obstruction from intussusception/rectal prolapse. No evidence of perforation or acute cardiopulmonary process.  Electronically Signed: By: Richardean Sale M.D. On: 04/08/2015 16:28    Medications / Allergies: per chart  Antibiotics: Anti-infectives    Start     Dose/Rate Route Frequency Ordered Stop   04/09/15 0700  cefoTEtan (CEFOTAN) 2 g in dextrose 5 % 50 mL IVPB     2 g 100 mL/hr over 30 Minutes Intravenous To [facility]Stay Surgical 04/08/15 1703 04/10/15 0700   04/09/15 0600  cefoTEtan (CEFOTAN) 2 g in dextrose 5 % 50 mL IVPB  Status:  Discontinued     2 g 100 mL/hr over 30 Minutes Intravenous On call to O.R. 04/08/15 1552 04/08/15 1706   04/09/15 0600  neomycin (MYCIFRADIN) tablet 1,000 mg    Comments:  Take 2 pills (=106m) by mouth 0600 the AM before your colorectal operation   1,000 mg Oral  Once 04/08/15 1545 04/09/15 0545   04/09/15 0600  metroNIDAZOLE (FLAGYL) tablet 1,000 mg    Comments:  Take 2 pills (=10026m by mouth 0600 the AM  before your colorectal operation   1,000 mg Oral  Once 04/08/15 1545 04/09/15 0545   04/09/15 0600  cefoTEtan (CEFOTAN) 2 g in dextrose 5 % 50 mL IVPB  Status:  Discontinued     2 g 100 mL/hr over 30 Minutes Intravenous On call to O.R. 04/08/15 1551 04/08/15 1555   04/09/15 0600  clindamycin (CLEOCIN) 900 mg, gentamicin (GARAMYCIN) 240 mg in sodium chloride 0.9 % 1,000 mL for intraperitoneal lavage    Comments:  Pharmacy may adjust dosing strength, schedule, rate of infusion, etc as needed to optimize therapy    Intraperitoneal To Surgery 04/08/15 1551 04/10/15 0600   04/08/15 2000  neomycin (MYCIFRADIN) tablet 1,000 mg     1,000 mg Oral 2 times per day on Thu Fri 04/08/15 1555 04/09/15 0140   04/08/15 2000  metroNIDAZOLE (FLAGYL) tablet 1,000 mg     1,000 mg Oral 2 times per day on Thu Fri 04/08/15 1555 04/09/15 0140   04/08/15 1600  neomycin (MYCIFRADIN) tablet 500 mg  Status:  Discontinued     500 mg Oral As directed 04/08/15 1545 04/08/15 1555   04/08/15 1600  metroNIDAZOLE (FLAGYL) tablet 500 mg  Status:  Discontinued    Comments:  Take 2 pills (=100084mby mouth at 3pm, and 10pm the day before your colorectal operation   500 mg Oral As directed 04/08/15 1545 04/08/15 1555        Note: Portions of this report may have been transcribed using voice recognition software. Every effort was made to ensure accuracy; however, inadvertent computerized transcription errors may be present.   Any transcriptional errors that result from this process are unintentional.     SteAdin Hector.D., F.A.C.S. Gastrointestinal and Minimally Invasive Surgery [facility] [id number] [facility] [place] [phone] / Paging   04/09/2015  CARE TEAM:  PCP: BLAElyn PeersD  Outpatient Care Team: Patient Care Team: VeiLucianne LeiD as PCP - General (Family Medicine)  Inpatient Treatment Team: Treatment Team: Attending Provider: Md Nolon NationsD;  Consulting Physician: Nolon Nations, MD; Consulting Physician: Michael Boston, MD; Technician: Leda Quail, NT

## 2015-04-09 NOTE — Op Note (Signed)
04/09/2015  4:14 PM  PATIENT:  Carol Dominguez  64 y.o. female  Patient Care Team: Lucianne Lei, MD as PCP - General (Family Medicine)  PRE-OPERATIVE DIAGNOSIS:  RECTAL PROLAPSE, Intussusception of rectum  POST-OPERATIVE DIAGNOSIS:    RECTAL PROLAPSE Sigmoid mass intussuscepted into rectum  PROCEDURE:    LAPAROSCOPIC LOW ANTERIOR RESECTION RECTOPEXY RIGID PROCTOSCOPY Rectal examination under anesthesia  SURGEON:  Surgeon(s): Michael Boston, MD Arta Bruce Kinsinger, MD - Assist  ANESTHESIA:   local and general  EBL:  Total I/O In: 2 [I.V.:2800] Out: 84 [Urine:50]  Delay start of Pharmacological VTE agent (>24hrs) due to surgical blood loss or risk of bleeding:  no  DRAINS: none   SPECIMEN:  Source of Specimen:    RECTOSIGMOID COLON WITH INTUSSUSCEPTION ANASTOMOTIC RINGS - Blue stitch in proximal ring  DISPOSITION OF SPECIMEN:  PATHOLOGY  COUNTS:  YES  PLAN OF CARE: Admit to inpatient   PATIENT DISPOSITION:  PACU - hemodynamically stable.  INDICATION:    Pleasant woman with rectal prolapse due to long colonic intussusception into rectum.  Possible polyp versus tumor felt and seen.  I recommended laparoscopic possible open resection possible colostomy. The anatomy & physiology of the digestive tract was discussed.  The pathophysiology of rectal prolapse was discussed.  Natural history risks without surgery was discussed.   I feel the risks of no intervention will lead to serious problems that outweigh the operative risks; therefore, I recommended surgery to treat the pathology.  Possible need for sigmoid colectomy to remove redundant colon was discussed.  Pexy by suture and probable mesh reinforcement was discussed as well.  Laparoscopic & open techniques were discussed.   Risks such as bleeding, infection, abscess, leak, reoperation, possible ostomy, hernia, stroke, heart attack, death, and other risks were discussed.   I noted a good likelihood this will help  address the problem.  Goals of post-operative recovery were discussed as well.  We will work to minimize complications.  An educational handout on the technique was given as well.  Questions were answered.  The patient expresses understanding & wishes to proceed with surgery. .  OR FINDINGS:   Patient had very long redundant sigmoid colon intussuscepted into the rectum out the anus.  When partially reduced 4 cm torr shape thickened area on antimesenteric wall of mid sigmoid colon suspicious for colon cancer or polyp.    No obvious metastatic disease on visceral parietal peritoneum or liver.  The anastomosis rests 10 cm from the anal verge by rigid proctoscopy.  DESCRIPTION:   Informed consent was confirmed.  The patient underwent general anaesthesia without difficulty.  The patient was positioned appropriately.  VTE prevention in place.  I did digital rectal examination.  Intussusception coming out the anus.  Not able to reduce it.  Again could feel a polyp-like mass on the mucosal surface leading point suspicious for tumor.  The patient's abdomen was clipped, prepped, & draped in a sterile fashion.  Surgical timeout confirmed our plan.  The patient was positioned in reverse Trendelenburg.  Abdominal entry was gained using optical entry technique in the right upper abdomen.  Entry was clean.  I induced carbon dioxide insufflation.  Camera inspection revealed no injury.  Extra ports were carefully placed under direct laparoscopic visualization.  Patient had dilated proximal colon.  I did lyse adhesions to help free some interloop adhesions of epiploic appendages and greater omentum off the colon and the bladder and vaginal cuff.  I could reflect the greater omentum and the  upper abdomen the small bowel in the upper abdomen.  the patient had dilated sigmoid colon obviously intussuscepting into the rectum at the at level of the sacral promontory.  I had not been able to reduce the intussusception back  up much.  I decided to proceed with rectosigmoid resection.    I scored the base of peritoneum of the right side of the mesentery of the left colon from the ligament of Treitz to the peritoneal reflection of the mid rectum.  I elevated the sigmoid mesentery and enetered into the retro-mesenteric plane. We were able to identify the left ureter and gonadal vessels. We kept those posterior within the retroperitoneum and elevated the left colon mesentery off that. I did isolated IMA pedicle but did not ligate it yet.  I continued distally and got into the avascular plane posterior to the mesorectum. This allowed me to help mobilize the rectum as well by freeing the mesorectum off the sacrum.   eventually had to reduce some of the intussusception out since the rectum was massively dilated with the intussusception and dissection was limited.  With the rectum reduce it most of the way, I was able to better him down the peritoneal coverings towards the peritoneal reflection on both the right and left sides of the rectum.  I could see the right and left ureters and stayed away from them.  I kept the lateral vascular pedicles to the rectum intact.   I dissected free the mesorectum off the sacrum down to the level of the coccyx for a good posterior mobilization.  I freed the anterior rectum off the rectovaginal septum quite distally for a good anterior mobilization.  I did transect some of the proximal rectal pedicles as things are quite inflamed and thinned out.   I skeletonized the lymph nodes off the inferior mesenteric artery pedicle.   I transected the superior-lateral hemorrhoidal artery and vein leaving the left colic pedicle intact.  We ensured hemostasis. I skeletonized the mesorectum at the  mid rectum that reached up to the sacral promontory.   I purposely left retroperitoneal and lateral attachments of the descending and splenic flexure: Alone to avoid over mobilization in this patient with rectal prolapse and  redundancy.  Chose an area of the mid descending colon that easily reached down towards the mid rectum.  I transected the mesentery radially to preserve good blood supply from the remaining proximal left colon mesentery.  I placed a wound protector through a Pfannenstiel incision in the suprapubic region, taking care to avoid bladder injury. I eviscerated the very redundant left colon.  There is still about a half foot of intussusception into the rectum with a thinned out area.  I transected at the mid rectum using a contour stapler. I was able to eviscerate the rectosigmoid and descending colon out the wound.  I found the middescending colon that was soft and easily reached down. I clamped the colon proximal to this area using a soft bowel clamp. I transected at the descending/sigmoid junction with a scalpel. I got healthy bleeding mucosa.  We sent the rectosigmoid colon specimen off to go to pathology (open end = proximal).  We sized the colon orifice.  I chose a 33 EEA anvil stapler system. I placed the anvil to the open end of the descending colon and closed around it using a 0 Prolene pursestring.  We did copious irrigation with crystalloid solution.  Hemostasis was good.  The distal end of the colon at the handle  easily reached down to the rectal stump, therefore, splenic flexure mobilization was not needed.      Dr Kieth Brightly scrubbed down and did gentle anal dilation and advanced the EEA stapler up the rectal stump. The spike was brought out at the provimal end of the rectal stump under direct visualization.  I attached the anvil of the proximal colon the spike of the stapler. Anvil was tightened down and held clamped for 60 seconds. The EEA stapler was fired and held clamped for 30 seconds. The stapler was released & removed. We noted 2 excellent anastomotic rings. Blue stitch is in the proximal ring.  Dr Kieth Brightly did rigid proctoscopy noted the anastomosis was at 10 cm from the anal verge consistent  with the proximal rectum.  We did a copious irrigation.  On the final irrigation we held some for the pelvic air leak test .  The rectum was insufflated the rectum while clamping the colon proximal to that anastomosis.  There was a negative air leak test. There was no tension of mesentery or bowel at the anastomosis.   Tissues looked viable.    We changed gown and gloves. The patient was re-draped & sterile unused instruments were used from this point out per colon SSI prevention protocol.  Hemostasis was good.   Ureters & bowel uninjured.  The anastomosis looked healthy.  I closed the 23mm port sites using Monocryl stitch and sterile dressing.  I closed the Pfannenstiel wound using a 0 Vicryl vertical peritoneal closure and a #1 PDS transverse anterior rectal fascial closure. I closed the skin with some interrupted Monocryl stitches. I placed antibiotic-soaked wicks into the closure at the corners & centrally x4 between those areas. I placed a sterile dressing.    Patient is being extubated go to recovery room. I discussed postop care with the patient in detail the office & in the holding area. Instructions are written. I updated the status of the patient to the patient's family and friends.  I made recommendations.  I answered questions.  Understanding & appreciation was expressed.   Adin Hector, M.D., F.A.C.S. Gastrointestinal and Minimally Invasive Surgery [facility] [address], [place],  [address] [phone] Main / Paging

## 2015-04-09 NOTE — Progress Notes (Signed)
Initial Nutrition Assessment  DOCUMENTATION CODES:   Not applicable  INTERVENTION:  Ensure Enlive po BID, each supplement provides 350 kcal and 20 grams of protein   NUTRITION DIAGNOSIS:   Inadequate oral intake related to inability to eat, vomiting, nausea as evidenced by per patient/family report.    GOAL:   Patient will meet greater than or equal to 90% of their needs    MONITOR:   PO intake, Supplement acceptance, I & O's, Labs, Weight trends, Skin  REASON FOR ASSESSMENT:   Malnutrition Screening Tool    ASSESSMENT:   Pt presented to [facility] with ab/rectal pain, blood in rectum. Blood has been in rectum for around 6 mos, but reports worstening along with clots. Presents diarrhea, rectal prolapse, intersuception. Pt was working with Dr. Marcello Moores of [facility] to manage, but continues to prolapse, has been able to push it back in by herself but could not this time. Pt is scheduled for Colon Resection today. Has been lying down to prevent it from coming back out.  Appetite has been poor, unable to eat/keep anything down. Reports a usual wt of 144, unsure how long ago that was normal. Reported 10-15# wt loss since the beginning of summer. Exhibits some mild loss of fat, but overall muscle has been retained.  Will provide Ensure Enlive BID to help meet increased needs/protein needs post-surgery.   Diet Order:  Diet NPO time specified Except for: Ice Chips, Sips with Meds  Skin:  Reviewed, no issues  Last BM:  03/22/2015  Height:   Ht Readings from Last 1 Encounters:  04/08/15 5\' 9"  (1.753 m)    Weight:   Wt Readings from Last 1 Encounters:  04/08/15 115 lb (52.164 kg)    Ideal Body Weight:  65.9 kg  BMI:  Body mass index is 16.97 kg/(m^2).  Estimated Nutritional Needs:   Kcal:  1500-1700  Protein:  50-60  Fluid:  >=/ 1.5L  EDUCATION NEEDS:   No education needs identified at this time   Carol Dominguez. Carol Ecklund, MS, RD LDN After Hours/Weekend Pager [phone]

## 2015-04-09 NOTE — Anesthesia Procedure Notes (Signed)
Procedure Name: Intubation Date/Time: 04/09/2015 1:05 PM Performed by: Danley Danker L Patient Re-evaluated:Patient Re-evaluated prior to inductionOxygen Delivery Method: Circle system utilized Preoxygenation: Pre-oxygenation with 100% oxygen Intubation Type: IV induction Ventilation: Mask ventilation without difficulty Laryngoscope Size: Miller and 2 Grade View: Grade I Tube type: Oral Tube size: 7.0 mm Number of attempts: 1 Airway Equipment and Method: Stylet Placement Confirmation: ETT inserted through vocal cords under direct vision,  breath sounds checked- equal and bilateral and positive ETCO2 Secured at: 21 cm Tube secured with: Tape Dental Injury: Teeth and Oropharynx as per pre-operative assessment

## 2015-04-09 NOTE — Anesthesia Postprocedure Evaluation (Signed)
  Anesthesia Post-op Note  Patient: Carol Dominguez  Procedure(s) Performed: Procedure(s): RECTOPEXY (N/A) LAPAROSCOPIC LOW ANTERIOR RESECTION (N/A)  Patient Location: PACU  Anesthesia Type:General  Level of Consciousness: awake, alert  and oriented  Airway and Oxygen Therapy: Patient Spontanous Breathing  Post-op Pain: mild  Post-op Assessment: Post-op Vital signs reviewed and Patient's Cardiovascular Status Stable              Post-op Vital Signs: Reviewed and stable  Last Vitals:  Filed Vitals:   04/09/15 1830  BP: 146/72  Pulse: 75  Temp: 37.3 C  Resp: 16    Complications: No apparent anesthesia complications

## 2015-04-10 DIAGNOSIS — C20 Malignant neoplasm of rectum: Secondary | ICD-10-CM | POA: Diagnosis not present

## 2015-04-10 LAB — CBC
HCT: 30.1 % — ABNORMAL LOW (ref 36.0–46.0)
Hemoglobin: 10.3 g/dL — ABNORMAL LOW (ref 12.0–15.0)
MCH: 30.5 pg (ref 26.0–34.0)
MCHC: 34.2 g/dL (ref 30.0–36.0)
MCV: 89.1 fL (ref 78.0–100.0)
PLATELETS: 233 10*3/uL (ref 150–400)
RBC: 3.38 MIL/uL — AB (ref 3.87–5.11)
RDW: 13.3 % (ref 11.5–15.5)
WBC: 5.9 10*3/uL (ref 4.0–10.5)

## 2015-04-10 LAB — BASIC METABOLIC PANEL
Anion gap: 7 (ref 5–15)
BUN: 6 mg/dL (ref 6–20)
CALCIUM: 7.8 mg/dL — AB (ref 8.9–10.3)
CO2: 26 mmol/L (ref 22–32)
CREATININE: 0.91 mg/dL (ref 0.44–1.00)
Chloride: 101 mmol/L (ref 101–111)
GFR calc non Af Amer: >60 mL/min (ref >60)
Glucose, Bld: 99 mg/dL (ref 65–99)
Potassium: 3 mmol/L — ABNORMAL LOW (ref 3.5–5.1)
SODIUM: 134 mmol/L — AB (ref 135–145)

## 2015-04-10 LAB — MAGNESIUM: MAGNESIUM: 1.6 mg/dL — AB (ref 1.7–2.4)

## 2015-04-10 MED ORDER — POTASSIUM CHLORIDE CRYS ER 20 MEQ PO TBCR
20.0000 meq | EXTENDED_RELEASE_TABLET | Freq: Two times a day (BID) | ORAL | Status: DC
  Administered 2015-04-10 – 2015-04-13 (×7): 20 meq via ORAL
  Filled 2015-04-10 (×9): qty 1

## 2015-04-10 NOTE — Progress Notes (Signed)
Patient ID: Carol Dominguez, female   DOB: 1951/04/04, 64 y.o.   MRN: [id number] 1 Day Post-Op  Subjective: Doing "great". Denies nausea. Has some incisional type pain when moving around easily relieved with meds. States she had a small bowel movement and passed gas. Tolerating liquids well.  Objective: Vital signs in last 24 hours: Temp:  [98 F (36.7 C)-99.6 F (37.6 C)] 98 F (36.7 C) (09/03 0600) Pulse Rate:  [64-78] 78 (09/03 0600) Resp:  [11-18] 18 (09/03 0600) BP: (114-174)/(62-80) 114/67 mmHg (09/03 0600) SpO2:  [98 %-100 %] 98 % (09/03 0600) Weight:  [55.203 kg (121 lb 11.2 oz)] 55.203 kg (121 lb 11.2 oz) (09/03 0635) Last BM Date: 03/22/15  Intake/Output from previous day: 09/02 0701 - 09/03 0700 In: 3745.4 [P.O.:360; I.V.:3335.4; IV Piggyback:50] Out: 1025 [Urine:1025] Intake/Output this shift:    General appearance: alert, cooperative and no distress GI: normal findings: soft, non-tender Incision/Wound: some old bloody drainage on bandage without evidence of infection  Lab Results:   Recent Labs  04/08/15 1726 04/10/15 0534  WBC 6.2 5.9  HGB 12.0 10.3*  HCT 35.9* 30.1*  PLT 273 233   BMET  Recent Labs  04/08/15 1726 04/10/15 0534  NA 141 134*  K 3.5 3.0*  CL 107 101  CO2 28 26  GLUCOSE 100* 99  BUN 7 6  CREATININE 1.08* 0.91  CALCIUM 8.3* 7.8*     Studies/Results: Dg Abd Acute W/chest  04/08/2015   ADDENDUM REPORT: 04/08/2015 16:57  ADDENDUM: Case was discussed by telephone with Dr. Johney Maine. Given the patient's rectal prolapse, progressive colonic distention and previous CT findings, it was not felt that a water-soluble enema could be performed for the requested indication. This was discussed with the patient as well.   Electronically Signed   By: Richardean Sale M.D.   On: 04/08/2015 16:57   04/08/2015   CLINICAL DATA:  Colocolonic intussusception on CT. Recurrent rectal prolapse.  EXAM: DG ABDOMEN ACUTE W/ 1V CHEST  COMPARISON:  CT 04/05/2015.   FINDINGS: The heart size and mediastinal contours are normal. The lungs are clear. There is no pleural effusion or pneumothorax. EKG snaps overlie the chest.  Compared with the recent CT, there is progressive gaseous distention of the colon. Enteric contrast from the CT is present within the colon. Clinically, the patient has rectal prolapse, and these findings are consistent with progressive distal colonic obstruction. There is no evidence of pneumatosis, free intraperitoneal air or extravasated enteric contrast. Scattered vascular calcifications are noted.  IMPRESSION: Progressive colonic distention suspicious for distal colon obstruction from intussusception/rectal prolapse. No evidence of perforation or acute cardiopulmonary process.  Electronically Signed: By: Richardean Sale M.D. On: 04/08/2015 16:28    Anti-infectives: Anti-infectives    Start     Dose/Rate Route Frequency Ordered Stop   04/09/15 2200  cefoTEtan (CEFOTAN) 2 g in dextrose 5 % 50 mL IVPB     2 g 100 mL/hr over 30 Minutes Intravenous Every 12 hours 04/09/15 1746 04/09/15 2153   04/09/15 0700  cefoTEtan (CEFOTAN) 2 g in dextrose 5 % 50 mL IVPB     2 g 100 mL/hr over 30 Minutes Intravenous To ShortStay Surgical 04/08/15 1703 04/09/15 1309   04/09/15 0600  cefoTEtan (CEFOTAN) 2 g in dextrose 5 % 50 mL IVPB  Status:  Discontinued     2 g 100 mL/hr over 30 Minutes Intravenous On call to O.R. 04/08/15 1552 04/08/15 1706   04/09/15 0600  neomycin (MYCIFRADIN) tablet 1,000  mg    Comments:  Take 2 pills (=1000mg ) by mouth 0600 the AM before your colorectal operation   1,000 mg Oral  Once 04/08/15 1545 04/09/15 0545   04/09/15 0600  metroNIDAZOLE (FLAGYL) tablet 1,000 mg    Comments:  Take 2 pills (=1000mg ) by mouth 0600 the AM before your colorectal operation   1,000 mg Oral  Once 04/08/15 1545 04/09/15 0545   04/09/15 0600  cefoTEtan (CEFOTAN) 2 g in dextrose 5 % 50 mL IVPB  Status:  Discontinued     2 g 100 mL/hr over 30 Minutes  Intravenous On call to O.R. 04/08/15 1551 04/08/15 1555   04/09/15 0600  clindamycin (CLEOCIN) 900 mg, gentamicin (GARAMYCIN) 240 mg in sodium chloride 0.9 % 1,000 mL for intraperitoneal lavage    Comments:  Pharmacy may adjust dosing strength, schedule, rate of infusion, etc as needed to optimize therapy    Intraperitoneal To Surgery 04/08/15 1551 04/10/15 0600   04/08/15 2000  neomycin (MYCIFRADIN) tablet 1,000 mg     1,000 mg Oral 2 times per day on Thu Fri 04/08/15 1555 04/09/15 0140   04/08/15 2000  metroNIDAZOLE (FLAGYL) tablet 1,000 mg     1,000 mg Oral 2 times per day on Thu Fri 04/08/15 1555 04/09/15 0140   04/08/15 1600  neomycin (MYCIFRADIN) tablet 500 mg  Status:  Discontinued     500 mg Oral As directed 04/08/15 1545 04/08/15 1555   04/08/15 1600  metroNIDAZOLE (FLAGYL) tablet 500 mg  Status:  Discontinued    Comments:  Take 2 pills (=1000mg ) by mouth at 3pm, and 10pm the day before your colorectal operation   500 mg Oral As directed 04/08/15 1545 04/08/15 1555      Assessment/Plan: s/p Procedure(s): RECTOPEXY LAPAROSCOPIC LOW ANTERIOR RESECTION Doing well postoperatively without complication. Hypokalemia-supplement today and check labs tomorrow.   LOS: 2 days    Carol Dominguez 04/10/2015

## 2015-04-11 LAB — CBC
HCT: 28.8 % — ABNORMAL LOW (ref 36.0–46.0)
Hemoglobin: 9.5 g/dL — ABNORMAL LOW (ref 12.0–15.0)
MCH: 29.3 pg (ref 26.0–34.0)
MCHC: 33 g/dL (ref 30.0–36.0)
MCV: 88.9 fL (ref 78.0–100.0)
PLATELETS: 254 10*3/uL (ref 150–400)
RBC: 3.24 MIL/uL — AB (ref 3.87–5.11)
RDW: 13.5 % (ref 11.5–15.5)
WBC: 6.2 10*3/uL (ref 4.0–10.5)

## 2015-04-11 LAB — BASIC METABOLIC PANEL
BUN: 5 mg/dL — AB (ref 6–20)
CO2: 31 mmol/L (ref 22–32)
Calcium: 7.9 mg/dL — ABNORMAL LOW (ref 8.9–10.3)
Chloride: 105 mmol/L (ref 101–111)
Creatinine, Ser: 0.73 mg/dL (ref 0.44–1.00)
Glucose, Bld: 105 mg/dL — ABNORMAL HIGH (ref 65–99)
POTASSIUM: 3.6 mmol/L (ref 3.5–5.1)
SODIUM: 137 mmol/L (ref 135–145)

## 2015-04-11 NOTE — Progress Notes (Signed)
Pt has had multiple loose stools today and this evening. She is oozing soft stool from her rectum. Area around anus irritated, applied barrier cream, and instructed pt how to do so after every loose stool.  Will continue to monitor.

## 2015-04-11 NOTE — Progress Notes (Signed)
Pt had a scant amount of mucus in her stool mixed with blood.  Will continue to monitor.

## 2015-04-11 NOTE — Progress Notes (Signed)
Patient ID: Carol Dominguez, female   DOB: [birth year], 64 y.o.   MRN: [id number] 2 Days Post-Op  Subjective: Doesn't feel as well today. Having some bloating and nausea without vomiting. States she has had frequent flatus and another very small bowel movement  Objective: Vital signs in last 24 hours: Temp:  [98.1 F (36.7 C)-99.5 F (37.5 C)] 99.5 F (37.5 C) (09/04 0640) Pulse Rate:  [69-80] 80 (09/04 0640) Resp:  [16-18] 16 (09/04 0640) BP: (106-115)/(51-74) 112/72 mmHg (09/04 0640) SpO2:  [98 %] 98 % (09/04 0640) Weight:  [57.3 kg (126 lb 5.2 oz)] 57.3 kg (126 lb 5.2 oz) (09/04 0640) Last BM Date: 03/22/15  Intake/Output from previous day: 09/03 0701 - 09/04 0700 In: -  Out: 1326 [Urine:1325; Stool:1] Intake/Output this shift:    General appearance: alert, cooperative and no distress GI: no significant distention. Soft and nontender. Incision/Wound: bloody drainage on suprapubic incision stable, no evidence of infection  Lab Results:   Recent Labs  04/10/15 0534 04/11/15 0551  WBC 5.9 6.2  HGB 10.3* 9.5*  HCT 30.1* 28.8*  PLT 233 254   BMET  Recent Labs  04/10/15 0534 04/11/15 0551  NA 134* 137  K 3.0* 3.6  CL 101 105  CO2 26 31  GLUCOSE 99 105*  BUN 6 5*  CREATININE 0.91 0.73  CALCIUM 7.8* 7.9*     Studies/Results: No results found.  Anti-infectives: Anti-infectives    Start     Dose/Rate Route Frequency Ordered Stop   04/09/15 2200  cefoTEtan (CEFOTAN) 2 g in dextrose 5 % 50 mL IVPB     2 g 100 mL/hr over 30 Minutes Intravenous Every 12 hours 04/09/15 1746 04/09/15 2153   04/09/15 0700  cefoTEtan (CEFOTAN) 2 g in dextrose 5 % 50 mL IVPB     2 g 100 mL/hr over 30 Minutes Intravenous To ShortStay Surgical 04/08/15 1703 04/09/15 1309   04/09/15 0600  cefoTEtan (CEFOTAN) 2 g in dextrose 5 % 50 mL IVPB  Status:  Discontinued     2 g 100 mL/hr over 30 Minutes Intravenous On call to O.R. 04/08/15 1552 04/08/15 1706   04/09/15 0600  neomycin  (MYCIFRADIN) tablet 1,000 mg    Comments:  Take 2 pills (=1000mg ) by mouth 0600 the AM before your colorectal operation   1,000 mg Oral  Once 04/08/15 1545 04/09/15 0545   04/09/15 0600  metroNIDAZOLE (FLAGYL) tablet 1,000 mg    Comments:  Take 2 pills (=1000mg ) by mouth 0600 the AM before your colorectal operation   1,000 mg Oral  Once 04/08/15 1545 04/09/15 0545   04/09/15 0600  cefoTEtan (CEFOTAN) 2 g in dextrose 5 % 50 mL IVPB  Status:  Discontinued     2 g 100 mL/hr over 30 Minutes Intravenous On call to O.R. 04/08/15 1551 04/08/15 1555   04/09/15 0600  clindamycin (CLEOCIN) 900 mg, gentamicin (GARAMYCIN) 240 mg in sodium chloride 0.9 % 1,000 mL for intraperitoneal lavage    Comments:  Pharmacy may adjust dosing strength, schedule, rate of infusion, etc as needed to optimize therapy    Intraperitoneal To Surgery 04/08/15 1551 04/10/15 0600   04/08/15 2000  neomycin (MYCIFRADIN) tablet 1,000 mg     1,000 mg Oral 2 times per day on Thu Fri 04/08/15 1555 04/09/15 0140   04/08/15 2000  metroNIDAZOLE (FLAGYL) tablet 1,000 mg     1,000 mg Oral 2 times per day on Thu Fri 04/08/15 1555 04/09/15 0140   04/08/15 1600  neomycin (MYCIFRADIN) tablet 500 mg  Status:  Discontinued     500 mg Oral As directed 04/08/15 1545 04/08/15 1555   04/08/15 1600  metroNIDAZOLE (FLAGYL) tablet 500 mg  Status:  Discontinued    Comments:  Take 2 pills (=1000mg ) by mouth at 3pm, and 10pm the day before your colorectal operation   500 mg Oral As directed 04/08/15 1545 04/08/15 1555      Assessment/Plan: s/p Procedure(s): RECTOPEXY LAPAROSCOPIC LOW ANTERIOR RESECTION Doing well without complication. Probably some expected mild ileus. Continue limited liquid diet today.   LOS: 3 days    Timea Breed T 04/11/2015

## 2015-04-12 NOTE — Progress Notes (Signed)
Patient ID: KENDY HASTON, female   DOB: [birth year], 64 y.o.   MRN: [id number] 3 Days Post-Op  Subjective: Having a lot of loose bowel movements. Denies abdominal pain. Hungry and she would like something to eat.  Objective: Vital signs in last 24 hours: Temp:  [98.4 F (36.9 C)-99.5 F (37.5 C)] 98.4 F (36.9 C) (09/04 2200) Pulse Rate:  [65-79] 65 (09/05 0515) Resp:  [16-18] 18 (09/05 0515) BP: (104-146)/(55-76) 146/74 mmHg (09/05 0515) SpO2:  [100 %] 100 % (09/05 0515) Weight:  [56.1 kg (123 lb 10.9 oz)] 56.1 kg (123 lb 10.9 oz) (09/05 0754) Last BM Date: 03/22/15  Intake/Output from previous day: 09/04 0701 - 09/05 0700 In: 240 [P.O.:240] Out: 500 [Urine:500] Intake/Output this shift:    General appearance: alert, cooperative and no distress GI: normal findings: soft, non-tender Incision/Wound: dressings clean and dry without evidence of infection  Lab Results:   Recent Labs  04/10/15 0534 04/11/15 0551  WBC 5.9 6.2  HGB 10.3* 9.5*  HCT 30.1* 28.8*  PLT 233 254   BMET  Recent Labs  04/10/15 0534 04/11/15 0551  NA 134* 137  K 3.0* 3.6  CL 101 105  CO2 26 31  GLUCOSE 99 105*  BUN 6 5*  CREATININE 0.91 0.73  CALCIUM 7.8* 7.9*     Studies/Results: No results found.  Anti-infectives: Anti-infectives    Start     Dose/Rate Route Frequency Ordered Stop   04/09/15 2200  cefoTEtan (CEFOTAN) 2 g in dextrose 5 % 50 mL IVPB     2 g 100 mL/hr over 30 Minutes Intravenous Every 12 hours 04/09/15 1746 04/09/15 2153   04/09/15 0700  cefoTEtan (CEFOTAN) 2 g in dextrose 5 % 50 mL IVPB     2 g 100 mL/hr over 30 Minutes Intravenous To ShortStay Surgical 04/08/15 1703 04/09/15 1309   04/09/15 0600  cefoTEtan (CEFOTAN) 2 g in dextrose 5 % 50 mL IVPB  Status:  Discontinued     2 g 100 mL/hr over 30 Minutes Intravenous On call to O.R. 04/08/15 1552 04/08/15 1706   04/09/15 0600  neomycin (MYCIFRADIN) tablet 1,000 mg    Comments:  Take 2 pills (=1000mg ) by  mouth 0600 the AM before your colorectal operation   1,000 mg Oral  Once 04/08/15 1545 04/09/15 0545   04/09/15 0600  metroNIDAZOLE (FLAGYL) tablet 1,000 mg    Comments:  Take 2 pills (=1000mg ) by mouth 0600 the AM before your colorectal operation   1,000 mg Oral  Once 04/08/15 1545 04/09/15 0545   04/09/15 0600  cefoTEtan (CEFOTAN) 2 g in dextrose 5 % 50 mL IVPB  Status:  Discontinued     2 g 100 mL/hr over 30 Minutes Intravenous On call to O.R. 04/08/15 1551 04/08/15 1555   04/09/15 0600  clindamycin (CLEOCIN) 900 mg, gentamicin (GARAMYCIN) 240 mg in sodium chloride 0.9 % 1,000 mL for intraperitoneal lavage    Comments:  Pharmacy may adjust dosing strength, schedule, rate of infusion, etc as needed to optimize therapy    Intraperitoneal To Surgery 04/08/15 1551 04/10/15 0600   04/08/15 2000  neomycin (MYCIFRADIN) tablet 1,000 mg     1,000 mg Oral 2 times per day on Thu Fri 04/08/15 1555 04/09/15 0140   04/08/15 2000  metroNIDAZOLE (FLAGYL) tablet 1,000 mg     1,000 mg Oral 2 times per day on Thu Fri 04/08/15 1555 04/09/15 0140   04/08/15 1600  neomycin (MYCIFRADIN) tablet 500 mg  Status:  Discontinued  500 mg Oral As directed 04/08/15 1545 04/08/15 1555   04/08/15 1600  metroNIDAZOLE (FLAGYL) tablet 500 mg  Status:  Discontinued    Comments:  Take 2 pills (=1000mg ) by mouth at 3pm, and 10pm the day before your colorectal operation   500 mg Oral As directed 04/08/15 1545 04/08/15 1555      Assessment/Plan: s/p Procedure(s): RECTOPEXY LAPAROSCOPIC LOW ANTERIOR RESECTION Doing well postoperatively without apparent complication. Advance to regular diet.   LOS: 4 days    Tzippy Testerman T 04/12/2015

## 2015-04-12 NOTE — Progress Notes (Signed)
Pt had a tegaderm on each elbow. I inquired about it, and pt stated both her elbows were raw from her leaning on them in the bed. Tegaderms were intact. Will continue to monitor.

## 2015-04-13 ENCOUNTER — Encounter (HOSPITAL_COMMUNITY): Payer: Self-pay | Admitting: Surgery

## 2015-04-13 MED ORDER — DOCUSATE SODIUM 100 MG PO CAPS: 100.0000 mg | ORAL_CAPSULE | Freq: Two times a day (BID) | ORAL | Status: DC | PRN

## 2015-04-13 MED ORDER — OXYCODONE-ACETAMINOPHEN 5-325 MG PO TABS: 1.0000 | ORAL_TABLET | Freq: Four times a day (QID) | ORAL | Status: DC | PRN

## 2015-04-13 MED ORDER — OXYCODONE-ACETAMINOPHEN 5-325 MG PO TABS: 1.0000 | ORAL_TABLET | ORAL | Status: DC | PRN

## 2015-04-13 MED ORDER — METHOCARBAMOL 500 MG PO TABS: 1000.0000 mg | ORAL_TABLET | Freq: Three times a day (TID) | ORAL | Status: DC | PRN

## 2015-04-13 MED ORDER — PSYLLIUM 58.6 % PO POWD: 1.0000 | Freq: Two times a day (BID) | ORAL | Status: DC | PRN

## 2015-04-13 MED ORDER — SACCHAROMYCES BOULARDII 250 MG PO CAPS: 250.0000 mg | ORAL_CAPSULE | Freq: Two times a day (BID) | ORAL | Status: DC

## 2015-04-13 MED ORDER — IBUPROFEN 400 MG PO TABS: 400.0000 mg | ORAL_TABLET | Freq: Three times a day (TID) | ORAL | Status: DC | PRN

## 2015-04-13 NOTE — Evaluation (Signed)
wPhysical Therapy Evaluation Patient Details Name: Carol Dominguez MRN: [id number] DOB: 06-27-1951 Today's Date: 04/13/2015   History of Present Illness  Sigmoid mass intussuscepted into rectum, s/p LAPAROSCOPIC LOW ANTERIOR RESECTION rectopexy on 9/2  Clinical Impression  Patient is mobilizing well, can benefit from HHPT for mobility and encourage recovery from abdominal  Surgery.     Follow Up Recommendations Home health PT    Equipment Recommendations   (4 wheeled or a 2 wheeled if unable to get  4)    Recommendations for Other Services       Precautions / Restrictions        Mobility  Bed Mobility Overal bed mobility: Independent                Transfers Overall transfer level: Modified independent Equipment used: Rolling walker (2 wheeled)                Ambulation/Gait Ambulation/Gait assistance: Modified independent (Device/Increase time) Ambulation Distance (Feet): 400 Feet Assistive device: Rolling walker (2 wheeled) Gait Pattern/deviations: Step-through pattern;Trunk flexed     General Gait Details: cues for posture  Stairs            Wheelchair Mobility    Modified Rankin (Stroke Patients Only)       Balance                                             Pertinent Vitals/Pain Pain Assessment: 0-10 Pain Score: 5  Pain Descriptors / Indicators: Contraction Pain Intervention(s): Limited activity within patient's tolerance;Monitored during session;Premedicated before session    Home Living Family/patient expects to be discharged to:: Private residence Living Arrangements: Non-relatives/Friends Available Help at Discharge: Friend(s) Type of Home: House Home Access: Ramped entrance     Home Layout: One level Home Equipment: None Additional Comments: patient reports that she has no bed, will sleep on couch.    Prior Function Level of Independence: Independent               Hand Dominance         Extremity/Trunk Assessment   Upper Extremity Assessment: Overall WFL for tasks assessed           Lower Extremity Assessment: Overall WFL for tasks assessed      Cervical / Trunk Assessment: Other exceptions  Communication   Communication: No difficulties  Cognition Arousal/Alertness: Awake/alert Behavior During Therapy: WFL for tasks assessed/performed Overall Cognitive Status: Within Functional Limits for tasks assessed                      General Comments      Exercises        Assessment/Plan    PT Assessment All further PT needs can be met in the next venue of care  PT Diagnosis Difficulty walking;Acute pain   PT Problem List Decreased activity tolerance;Decreased range of motion;Pain;Decreased mobility  PT Treatment Interventions     PT Goals (Current goals can be found in the Care Plan section) Acute Rehab PT Goals Patient Stated Goal: to go home PT Goal Formulation: All assessment and education complete, DC therapy    Frequency     Barriers to discharge        Co-evaluation               End of Session   Activity Tolerance: Patient tolerated  treatment well Patient left: in chair Nurse Communication: Mobility status         Time: 1140-1156 PT Time Calculation (min) (ACUTE ONLY): 16 min   Charges:   PT Evaluation $Initial PT Evaluation Tier I: 1 Procedure     PT G CodesClaretha Cooper 04/13/2015, 1:05 PM Tresa Endo PT [phone]

## 2015-04-13 NOTE — Discharge Summary (Signed)
[facility] Discharge Summary   Patient ID: Carol Dominguez MRN: [id number] DOB/AGE: 03-08-1951 64 y.o.  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 Claustrophobia 04/09/2015  . Internal complete rectal prolapse with intussusception of rectosigmoid 04/09/2015  . Tobacco use   . Rectal prolapse 04/08/2015  . 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  Procedures Dr. Johney Maine (04/09/15) - EUA, LAPAROSCOPIC LOW ANTERIOR RESECTION, RECTOPEXY, RIGID PROCTOSCOPY   Hospital Course:  64 y/o AA female who presented to [facility] on 04/05/15 with new onset abdominal/rectal pain with worsening blood per rectum. She has had blood per rectum for around 6 months or so, but recently she had a larger amount of blood and some clots. She has assumed the bleeding was related to hemorrhoids. She just started noticing the prolapsed tissue at her rectum. Her pain can get to a 10/10 in her rectum and cramping pain in her abdomen. She's been nauseated, vomiting, only able to keep down liquids for almost 2 weeks. She was discharged to follow up with Dr. Marcello Moores of [facility] for further evaluation and management, but she keeps prolapsing at home and she's been able to push it in herself once, but this time it wouldn't go back in. She hasn't had an significant BM's in 2 weeks due to low oral intake. She does c/o mucous and a little blood currently. She's been mostly laying down to prevent it from coming back out. No radiating pain, no other alleviating/aggrevating factors. She doesn't go to doctors. She has not had any recent mammogram, CSP or GI doctor. She denies any family or personal history of cancer or GI diseases  other than hemorrhoids. H/o open ovarian cyst removal 45 years ago, and right inguinal hernia repair at 33-32 years old. No other abdominal surgeries.   Workup showed rectal prolapse and intussusception of rectum due to rectal mass.  Patient was admitted and underwent procedure listed above.  Tolerated procedure well and was transferred to the floor.  Diet was advanced as tolerated.  Physical therapy recommended a rolling walker, bedside commode, and home health PT - these were ordered.  On POD #4, the patient was voiding well, tolerating diet, ambulating well, pain well controlled, vital signs stable, incisions c/d/i and felt stable for discharge home with her fiancee.  Patient will follow up in our office in 2-3 weeks and knows to call with questions or concerns.  She will call to confirm appointment date/time.        Medication List    STOP taking these medications        psyllium 0.52 G capsule  Commonly known as:  METAMUCIL  Replaced by:  psyllium 58.6 % powder      TAKE these medications        albuterol 108 (90 BASE) MCG/ACT inhaler  Commonly known as:  PROVENTIL HFA;VENTOLIN HFA  Inhale 2 puffs into the lungs every 6 (six) hours as needed for wheezing or shortness of breath.     docusate sodium 100 MG capsule  Commonly known as:  COLACE  Take 1 capsule (100 mg total) by mouth 2 (two) times daily as needed for mild constipation.     ibuprofen 400 MG tablet  Commonly known as:  ADVIL,MOTRIN  Take  1 tablet (400 mg total) by mouth every 8 (eight) hours as needed for moderate pain. Take with food to avoid stomach upset.     methocarbamol 500 MG tablet  Commonly known as:  ROBAXIN  Take 2 tablets (1,000 mg total) by mouth every 8 (eight) hours as needed for muscle spasms.     oxyCODONE-acetaminophen 5-325 MG per tablet  Commonly known as:  PERCOCET/ROXICET  Take 1-2 tablets by mouth every 6 (six) hours as needed for moderate pain or severe pain.     psyllium 58.6 % powder   Commonly known as:  METAMUCIL SMOOTH TEXTURE  Take 1 packet by mouth 2 (two) times daily as needed.     saccharomyces boulardii 250 MG capsule  Commonly known as:  FLORASTOR  Take 1 capsule (250 mg total) by mouth 2 (two) times daily.         Follow-up Information    Follow up with GROSS,STEVEN C., MD. Call in 2 weeks.   Specialty:  General Surgery   Why:  For post-operation check with Dr. Johney Maine in 2-3 weeks.  Call on 04/15/15 to find out the results of your pathology/tissue report for the rectal mass.     Contact information:   [address] [place] [address] [phone]       Follow up with [name], MD. Schedule an appointment as soon as possible for a visit in 2 weeks.   Specialty:  Family Medicine   Why:  For post-hospital follow up   Contact information:   [address] [place] [address] [phone]       Follow up with [facility].   Why:  physical therapy   Contact information:   [place] [address] [phone]       Signed: Vaughan Basta [facility] [phone]  04/13/2015, 3:14 PM

## 2015-04-13 NOTE — Discharge Instructions (Signed)
[facility]      [facility] [phone]  OPEN ABDOMINAL SURGERY: POST OP INSTRUCTIONS  Always review your discharge instruction sheet given to you by the facility where your surgery was performed.  IF YOU HAVE DISABILITY OR FAMILY LEAVE FORMS, YOU MUST BRING THEM TO THE OFFICE FOR PROCESSING.  PLEASE DO NOT GIVE THEM TO YOUR DOCTOR.  1. A prescription for pain medication may be given to you upon discharge.  Take your pain medication as prescribed, if needed.  If narcotic pain medicine is not needed, then you may take acetaminophen (Tylenol) or ibuprofen (Advil) as needed. 2. Take your usually prescribed medications unless otherwise directed. 3. If you need a refill on your pain medication, please contact your pharmacy. They will contact our office to request authorization.  Prescriptions will not be filled after 5pm or on week-ends. 4. You should follow a light diet the first few days after arrival home, such as soup and crackers, pudding, etc.unless your doctor has advised otherwise. A high-fiber, low fat diet can be resumed as tolerated.   Be sure to include lots of fluids daily. Most patients will experience some swelling and bruising on the chest and neck area.  Ice packs will help.  Swelling and bruising can take several days to resolve 5. Most patients will experience some swelling and bruising in the area of the incision. Ice pack will help. Swelling and bruising can take several days to resolve..  6. It is common to experience some constipation if taking pain medication after surgery.  Increasing fluid intake and taking a stool softener will usually help or prevent this problem from occurring.  A mild laxative (Milk of Magnesia or Miralax) should be taken according to package directions if there are no bowel movements after 48 hours. 7.  You may have steri-strips (small skin tapes) in place directly over the incision.  These strips should be left on the skin for 7-10 days.  If your  surgeon used skin glue on the incision, you may shower in 24 hours.  The glue will flake off over the next 2-3 weeks.  Any sutures or staples will be removed at the office during your follow-up visit. You may find that a light gauze bandage over your incision may keep your staples from being rubbed or pulled. You may shower and replace the bandage daily. 8. ACTIVITIES:  You may resume regular (light) daily activities beginning the next day--such as daily self-care, walking, climbing stairs--gradually increasing activities as tolerated.  You may have sexual intercourse when it is comfortable.  Refrain from any heavy lifting or straining until approved by your doctor. a. You may drive when you no longer are taking prescription pain medication, you can comfortably wear a seatbelt, and you can safely maneuver your car and apply brakes b. Return to Work: ___________________________________ 9. You should see your doctor in the office for a follow-up appointment approximately two weeks after your surgery.  Make sure that you call for this appointment within a day or two after you arrive home to insure a convenient appointment time. OTHER INSTRUCTIONS:  _____________________________________________________________ _____________________________________________________________  WHEN TO CALL YOUR DOCTOR: 1. Fever over 101.0 2. Inability to urinate 3. Nausea and/or vomiting 4. Extreme swelling or bruising 5. Continued bleeding from incision. 6. Increased pain, redness, or drainage from the incision. 7. Difficulty swallowing or breathing 8. Muscle cramping or spasms. 9. Numbness or tingling in hands or feet or around lips.  The clinic staff is available to   answer your questions during regular business hours.  Please don't hesitate to call and ask to speak to one of the nurses if you have concerns.  For further questions, please visit [email or web address]   

## 2015-04-13 NOTE — Progress Notes (Signed)
[facility] Progress Note  4 Days Post-Op  Subjective: Pt says pain in rectum/abdomen are minimal, but not well controlled with ibuprofen.  No N/V, cleaned her solid food plate.  Ambulating well, but concerned about the hills in her yard.  She says she just moved and doesn't have a bed yet.  She's going to be sleeping on her sofa.      Objective: Vital signs in last 24 hours: Temp:  [98.4 F (36.9 C)-99 F (37.2 C)] 98.7 F (37.1 C) (09/06 0549) Pulse Rate:  [59-77] 66 (09/06 0549) Resp:  [16-18] 16 (09/06 0549) BP: (119-145)/(60-77) 119/67 mmHg (09/06 0549) SpO2:  [100 %] 100 % (09/06 0549) Weight:  [56.3 kg (124 lb 1.9 oz)] 56.3 kg (124 lb 1.9 oz) (09/06 0700) Last BM Date: 04/13/15  Intake/Output from previous day: 09/05 0701 - 09/06 0700 In: 1080 [P.O.:1080] Out: 500 [Urine:500] Intake/Output this shift:    PE: Gen:  Alert, NAD, pleasant Abd: Soft, NT/ND, +BS, no HSM, incisions C/D/I   Lab Results:   Recent Labs  04/11/15 0551  WBC 6.2  HGB 9.5*  HCT 28.8*  PLT 254   BMET  Recent Labs  04/11/15 0551  NA 137  K 3.6  CL 105  CO2 31  GLUCOSE 105*  BUN 5*  CREATININE 0.73  CALCIUM 7.9*   PT/INR No results for input(s): LABPROT, INR in the last 72 hours. CMP     Component Value Date/Time   NA 137 04/11/2015 0551   K 3.6 04/11/2015 0551   CL 105 04/11/2015 0551   CO2 31 04/11/2015 0551   GLUCOSE 105* 04/11/2015 0551   BUN 5* 04/11/2015 0551   CREATININE 0.73 04/11/2015 0551   CALCIUM 7.9* 04/11/2015 0551   PROT 5.3* 04/08/2015 1726   ALBUMIN 2.7* 04/08/2015 1726   AST 38 04/08/2015 1726   ALT 18 04/08/2015 1726   ALKPHOS 46 04/08/2015 1726   BILITOT 0.9 04/08/2015 1726   GFRNONAA >60 04/11/2015 0551   GFRAA >60 04/11/2015 0551   Lipase     Component Value Date/Time   LIPASE 18* 04/05/2015 0018       Studies/Results: No results found.  Anti-infectives: Anti-infectives    Start     Dose/Rate Route Frequency Ordered  Stop   04/09/15 2200  cefoTEtan (CEFOTAN) 2 g in dextrose 5 % 50 mL IVPB     2 g 100 mL/hr over 30 Minutes Intravenous Every 12 hours 04/09/15 1746 04/09/15 2153   04/09/15 0700  cefoTEtan (CEFOTAN) 2 g in dextrose 5 % 50 mL IVPB     2 g 100 mL/hr over 30 Minutes Intravenous To [facility] 04/08/15 1703 04/09/15 1309   04/09/15 0600  cefoTEtan (CEFOTAN) 2 g in dextrose 5 % 50 mL IVPB  Status:  Discontinued     2 g 100 mL/hr over 30 Minutes Intravenous On call to O.R. 04/08/15 1552 04/08/15 1706   04/09/15 0600  neomycin (MYCIFRADIN) tablet 1,000 mg    Comments:  Take 2 pills (=1000mg ) by mouth 0600 the AM before your colorectal operation   1,000 mg Oral  Once 04/08/15 1545 04/09/15 0545   04/09/15 0600  metroNIDAZOLE (FLAGYL) tablet 1,000 mg    Comments:  Take 2 pills (=1000mg ) by mouth 0600 the AM before your colorectal operation   1,000 mg Oral  Once 04/08/15 1545 04/09/15 0545   04/09/15 0600  cefoTEtan (CEFOTAN) 2 g in dextrose 5 % 50 mL IVPB  Status:  Discontinued  2 g 100 mL/hr over 30 Minutes Intravenous On call to O.R. 04/08/15 1551 04/08/15 1555   04/09/15 0600  clindamycin (CLEOCIN) 900 mg, gentamicin (GARAMYCIN) 240 mg in sodium chloride 0.9 % 1,000 mL for intraperitoneal lavage    Comments:  Pharmacy may adjust dosing strength, schedule, rate of infusion, etc as needed to optimize therapy    Intraperitoneal To Surgery 04/08/15 1551 04/10/15 0600   04/08/15 2000  neomycin (MYCIFRADIN) tablet 1,000 mg     1,000 mg Oral 2 times per day on Thu Fri 04/08/15 1555 04/09/15 0140   04/08/15 2000  metroNIDAZOLE (FLAGYL) tablet 1,000 mg     1,000 mg Oral 2 times per day on Thu Fri 04/08/15 1555 04/09/15 0140   04/08/15 1600  neomycin (MYCIFRADIN) tablet 500 mg  Status:  Discontinued     500 mg Oral As directed 04/08/15 1545 04/08/15 1555   04/08/15 1600  metroNIDAZOLE (FLAGYL) tablet 500 mg  Status:  Discontinued    Comments:  Take 2 pills (=1000mg ) by mouth at 3pm, and 10pm  the day before your colorectal operation   500 mg Oral As directed 04/08/15 1545 04/08/15 1555       Assessment/Plan Rectal prolapse with intussusception of rectum Sigmoid mass POD #4 s/p LAPAROSCOPIC LOW ANTERIOR RESECTION, RECTOPEXY, RIGID PROCTOSCOPY, Rectal examination under anesthesia -Pending pathology -Regular diet, oral pain meds -Ambulate and IS (2000) -SCD's and lovenox -Ordered physical therapy, she's concerned about her mobility and hills to her new home. -F/u with Dr. Johney Maine    LOS: 5 days    Nat Christen 04/13/2015, 8:35 AM Pager: [phone]

## 2015-04-13 NOTE — Progress Notes (Signed)
Patient is being discharged to home with home health. Per Vinnie Level, Case Manager, patient is set up for [facility] to come to her house beginning tomorrow. Patients roller walker and DME3 was delivered to room for discharge. Reviewed discharge information, education, medications and instructions with patient. Patient states understanding. Will continue to monitor patient until ride has arrived.

## 2015-04-13 NOTE — Care Management Note (Addendum)
Case Management Note  Patient Details  Name: Carol Dominguez MRN: [id number] Date of Birth: February 20, 1951  Subjective/Objective:         POD #4 s/p LAPAROSCOPIC LOW ANTERIOR RESECTION, RECTOPEXY, RIGID PROCTOSCOPY           Action/Plan: Discharge planning, spoke with patient at bedside. Discussed with patient d/c needs, patient has contacted family and they are available to assist at d/c with ADL's. Per PT evaluation, recommendations for [facility] PT made. Patient is agreeable, has used [facility] in the past and would like to use them again. Contacted [facility] for referral, they will also provide RW with seat. Discussed medication concerns with patient, she does not normally take anything but OTC meds, the inhaler she uses she got from a friend. Her primary concern is about getting her pain meds. Talked to her about discussing with her doctor her concerns so he will order an affordable med. She is not sure if she has medication coverage, encourage her to look at her insurance and call around to several pharmacies to get the best price. Patient appreciative of assistance.   Expected Discharge Date:  04/10/15               Expected Discharge Plan:  [place]  In-House Referral:  NA  Discharge planning Services  CM Consult  Post Acute Care Choice:  Home Health Choice offered to:  Patient  DME Arranged:  Walker rolling with seat DME Agency:  [place]:  PT [facility] Agency:  [place]  Status of Service:  Completed, signed off  Medicare Important Message Given:    Date Medicare IM Given:    Medicare IM give by:    Date Additional Medicare IM Given:    Additional Medicare Important Message give by:     If discussed at [place] of Stay Meetings, dates discussed:    Additional Comments:  Guadalupe Maple, RN 04/13/2015, 3:15 PM

## 2015-04-14 DIAGNOSIS — E440 Moderate protein-calorie malnutrition: Secondary | ICD-10-CM

## 2015-04-20 ENCOUNTER — Encounter (HOSPITAL_COMMUNITY): Payer: Self-pay

## 2015-06-25 ENCOUNTER — Encounter (HOSPITAL_COMMUNITY): Payer: Self-pay | Admitting: *Deleted

## 2015-06-25 ENCOUNTER — Emergency Department (HOSPITAL_COMMUNITY)
Admission: EM | Admit: 2015-06-25 | Discharge: 2015-06-25 | Disposition: A | Discharge status: Home / Self Care | Payer: Medicare Other | Attending: Emergency Medicine | Admitting: Emergency Medicine

## 2015-06-25 DIAGNOSIS — F17210 Nicotine dependence, cigarettes, uncomplicated: Secondary | ICD-10-CM | POA: Insufficient documentation

## 2015-06-25 DIAGNOSIS — Z79899 Other long term (current) drug therapy: Secondary | ICD-10-CM | POA: Insufficient documentation

## 2015-06-25 DIAGNOSIS — G479 Sleep disorder, unspecified: Secondary | ICD-10-CM | POA: Diagnosis not present

## 2015-06-25 DIAGNOSIS — K0889 Other specified disorders of teeth and supporting structures: Secondary | ICD-10-CM | POA: Insufficient documentation

## 2015-06-25 DIAGNOSIS — Z8781 Personal history of (healed) traumatic fracture: Secondary | ICD-10-CM | POA: Diagnosis not present

## 2015-06-25 DIAGNOSIS — K002 Abnormalities of size and form of teeth: Secondary | ICD-10-CM | POA: Diagnosis not present

## 2015-06-25 DIAGNOSIS — J45909 Unspecified asthma, uncomplicated: Secondary | ICD-10-CM | POA: Insufficient documentation

## 2015-06-25 DIAGNOSIS — Z8742 Personal history of other diseases of the female genital tract: Secondary | ICD-10-CM | POA: Diagnosis not present

## 2015-06-25 DIAGNOSIS — Z862 Personal history of diseases of the blood and blood-forming organs and certain disorders involving the immune mechanism: Secondary | ICD-10-CM | POA: Insufficient documentation

## 2015-06-25 MED ORDER — PENICILLIN V POTASSIUM 250 MG PO TABS
250.0000 mg | ORAL_TABLET | Freq: Once | ORAL | Status: AC
  Administered 2015-06-25: 250 mg via ORAL
  Filled 2015-06-25: qty 1

## 2015-06-25 MED ORDER — PENICILLIN V POTASSIUM 250 MG PO TABS: 250.0000 mg | ORAL_TABLET | Freq: Four times a day (QID) | ORAL | Status: AC

## 2015-06-25 NOTE — ED Notes (Signed)
Pt states R lower dental pain that radiates to R temple and down R neck.  She tried to have the teeth pulled 1 year ago at a free clinic, but was told she needs an Chief Financial Officer.

## 2015-06-25 NOTE — Discharge Instructions (Signed)

## 2015-06-25 NOTE — ED Provider Notes (Signed)
CSN: [id number]     Arrival date & time 06/25/15  1725 History  By signing my name below, I, Soijett Blue, attest that this documentation has been prepared under the direction and in the presence of Montine Circle, PA-C Electronically Signed: Soijett Blue, ED Scribe. 06/25/2015. 6:18 PM.   Chief Complaint  Patient presents with  . Dental Pain      The history is provided by the patient. No language interpreter was used.    Carol Dominguez is a 64 y.o. female who presents to the Emergency Department complaining of constant, moderate, right lower dental pain onset 1 year. She notes that her right lower dental pain radiates to her right temple and down her right sided neck. She was trying to go have the tooth pulled last year at a free clinic but she was informed that she will need an oral Psychologist, sport and exercise. She states that she has not tried any medications for the relief for her symptoms. She denies fever, chills, and any other symptoms. She denies allergies to antibiotics at this time.   Past Medical History  Diagnosis Date  . Ovarian cyst 1975    [facility]  . Asthma   . Teeth decayed   . Iron deficiency   . Foot fracture, right   . Tobacco use    Past Surgical History  Procedure Laterality Date  . Ovarian cyst removal Right 1975    Dr. Ronnald Ramp  . Inguinal hernia repair Right     54-22 y/o - Dr. Ronnald Ramp  . Dilation and curettage of uterus    . Rectopexy N/A 04/09/2015    Procedure: RECTOPEXY;  Surgeon: Michael Boston, MD;  Location: [facility] ORS;  Service: General;  Laterality: N/A;  . Laparoscopic low anterior resection N/A 04/09/2015    Procedure: LAPAROSCOPIC LOW ANTERIOR RESECTION;  Surgeon: Michael Boston, MD;  Location: [facility] ORS;  Service: General;  Laterality: N/A;   No family history on file. Social History  Substance Use Topics  . Smoking status: Current Every Day Smoker -- 0.50 packs/day    Types: Cigarettes  . Smokeless tobacco: None  . Alcohol Use: No   OB History    No data  available     Review of Systems  Constitutional: Negative for fever and chills.  HENT: Positive for dental problem. Negative for drooling.   Neurological: Negative for speech difficulty.  Psychiatric/Behavioral: Positive for sleep disturbance.      Allergies  Codeine  Home Medications   Prior to Admission medications   Medication Sig Start Date End Date Taking? Authorizing Provider  albuterol (PROVENTIL HFA;VENTOLIN HFA) 108 (90 BASE) MCG/ACT inhaler Inhale 2 puffs into the lungs every 6 (six) hours as needed for wheezing or shortness of breath.    Historical Provider, MD  docusate sodium (COLACE) 100 MG capsule Take 1 capsule (100 mg total) by mouth 2 (two) times daily as needed for mild constipation. 04/13/15   Nat Christen, PA-C  ibuprofen (ADVIL,MOTRIN) 400 MG tablet Take 1 tablet (400 mg total) by mouth every 8 (eight) hours as needed for moderate pain. Take with food to avoid stomach upset. 04/13/15   Nat Christen, PA-C  methocarbamol (ROBAXIN) 500 MG tablet Take 2 tablets (1,000 mg total) by mouth every 8 (eight) hours as needed for muscle spasms. 04/13/15   Nat Christen, PA-C  oxyCODONE-acetaminophen (PERCOCET/ROXICET) 5-325 MG per tablet Take 1-2 tablets by mouth every 6 (six) hours as needed for moderate pain or severe pain. 04/13/15  Nat Christen, PA-C  psyllium (METAMUCIL SMOOTH TEXTURE) 58.6 % powder Take 1 packet by mouth 2 (two) times daily as needed. 04/13/15   Nat Christen, PA-C  saccharomyces boulardii (FLORASTOR) 250 MG capsule Take 1 capsule (250 mg total) by mouth 2 (two) times daily. 04/13/15   Megan N Baird, PA-C   BP 159/103 mmHg  Pulse 84  Temp(Src) 99.6 F (37.6 C) (Oral)  Resp 20  Ht 5\' 9"  (1.753 m)  Wt 118 lb (53.524 kg)  BMI 17.42 kg/m2  SpO2 97% Physical Exam  Constitutional: She is oriented to person, place, and time. She appears well-developed and well-nourished. No distress.  HENT:  Head: Normocephalic and atraumatic.  Mouth/Throat:    Poor  dentition throughout.  Affected tooth as diagrammed.  No signs of peritonsillar or tonsillar abscess.  No signs of gingival abscess. Oropharynx is clear and without exudates.  Uvula is midline.  Airway is intact. No signs of Ludwig's angina with palpation of oral and sublingual mucosa.   Eyes: Conjunctivae and EOM are normal.  Neck: Normal range of motion. Neck supple.  Cardiovascular: Normal rate.   Pulmonary/Chest: Effort normal. No respiratory distress.  Abdominal: She exhibits no distension.  Musculoskeletal: Normal range of motion.  Neurological: She is alert and oriented to person, place, and time.  Skin: Skin is warm and dry.  Psychiatric: She has a normal mood and affect. Her behavior is normal. Judgment and thought content normal.  Nursing note and vitals reviewed.   ED Course  Procedures (including critical care time) DIAGNOSTIC STUDIES: Oxygen Saturation is 97% on RA, nl by my interpretation.    COORDINATION OF CARE: 6:18 PM Discussed treatment plan with pt at bedside which includes abx Rx and referral to oral surgeon and pt agreed to plan.    MDM   Final diagnoses:  Pain, dental    Patient with toothache.  No gross abscess.  Exam unconcerning for Ludwig's angina or spread of infection.  Will treat with penicillin and OTC pain medicine.  Urged patient to follow-up with dentist.  Gave referral to oral surgery.    I personally performed the services described in this documentation, which was scribed in my presence. The recorded information has been reviewed and is accurate.      Montine Circle, PA-C 06/25/15 1823  Daleen Bo, MD 06/25/15 [phone]

## 2015-07-22 ENCOUNTER — Encounter: Payer: Self-pay | Admitting: Surgery

## 2015-08-18 ENCOUNTER — Encounter: Payer: Self-pay | Admitting: Internal Medicine

## 2015-08-19 ENCOUNTER — Telehealth: Payer: Self-pay | Admitting: *Deleted

## 2015-08-19 NOTE — Telephone Encounter (Signed)
[facility] for colonoscopy as scheduled Referred by Dr. Johney Maine

## 2015-08-19 NOTE — Telephone Encounter (Signed)
Will proceed as scheduled. Carol Dominguez  

## 2015-08-19 NOTE — Telephone Encounter (Signed)
Dr Hilarie Fredrickson, This patient is scheduled with you for a direct colon on 1-30 Monday . The notes state she has no GI history. 04-09-2015 she had emergency surgery for a sigmoid mass intussuscepted into the rectum and out the anus. She did not require chemoradiation. Her post op visit 06-2015 with surgeon he recommended her to have a colonoscopy. I cannot find that she has had a previous colon.  She has a [facility] in her sister. Is it ok for her to have a colon 1-30 Monday. The surgeon's note states she is ok to proceed as it's been 2 months since surgery at the time of the note. Just need to run by you to proceed   Thanks for your time,  Marijean Niemann

## 2015-08-25 ENCOUNTER — Encounter: Payer: Self-pay | Admitting: Internal Medicine

## 2015-08-26 ENCOUNTER — Ambulatory Visit (AMBULATORY_SURGERY_CENTER): Payer: Self-pay

## 2015-08-26 VITALS — Ht 69.0 in | Wt 122.0 lb

## 2015-08-26 DIAGNOSIS — Z85038 Personal history of other malignant neoplasm of large intestine: Secondary | ICD-10-CM

## 2015-08-26 MED ORDER — NA SULFATE-K SULFATE-MG SULF 17.5-3.13-1.6 GM/177ML PO SOLN: 1.0000 | Freq: Once | ORAL | Status: DC

## 2015-08-26 NOTE — Progress Notes (Signed)
No egg or soy allergies Not on home 02 No previous anesthesia complications Emmi video emailed to [email or web address] No diet or weight loss meds Sample Suprep given to patient

## 2015-09-06 ENCOUNTER — Ambulatory Visit (AMBULATORY_SURGERY_CENTER): Payer: Commercial Managed Care - HMO | Admitting: Internal Medicine

## 2015-09-06 ENCOUNTER — Encounter: Payer: Self-pay | Admitting: Internal Medicine

## 2015-09-06 ENCOUNTER — Encounter: Payer: Medicare Other | Admitting: Internal Medicine

## 2015-09-06 VITALS — BP 155/71 | HR 58 | Temp 97.3°F | Resp 20 | Ht 69.0 in | Wt 122.0 lb

## 2015-09-06 DIAGNOSIS — D124 Benign neoplasm of descending colon: Secondary | ICD-10-CM

## 2015-09-06 DIAGNOSIS — D122 Benign neoplasm of ascending colon: Secondary | ICD-10-CM

## 2015-09-06 DIAGNOSIS — D125 Benign neoplasm of sigmoid colon: Secondary | ICD-10-CM

## 2015-09-06 DIAGNOSIS — D120 Benign neoplasm of cecum: Secondary | ICD-10-CM

## 2015-09-06 DIAGNOSIS — Z85038 Personal history of other malignant neoplasm of large intestine: Secondary | ICD-10-CM | POA: Diagnosis not present

## 2015-09-06 DIAGNOSIS — D123 Benign neoplasm of transverse colon: Secondary | ICD-10-CM

## 2015-09-06 DIAGNOSIS — Z1211 Encounter for screening for malignant neoplasm of colon: Secondary | ICD-10-CM | POA: Diagnosis not present

## 2015-09-06 MED ORDER — SODIUM CHLORIDE 0.9 % IV SOLN: 500.0000 mL | INTRAVENOUS | Status: DC

## 2015-09-06 NOTE — Progress Notes (Signed)
A/ox3, pleased with MAC, report to  Celia RN 

## 2015-09-06 NOTE — Progress Notes (Signed)
Called to room to assist during endoscopic procedure.  Patient ID and intended procedure confirmed with present staff. Received instructions for my participation in the procedure from the performing physician.  

## 2015-09-06 NOTE — Op Note (Signed)
[place]  [address]. [place], [id number]   COLONOSCOPY PROCEDURE REPORT  PATIENT: Carol, Dominguez  MR#: [id number] BIRTHDATE: 10-Nov-1950 , 64  yrs. old GENDER: female ENDOSCOPIST: Jerene Bears, MD REFERRED [id number] Gross, M.D. PROCEDURE DATE:  09/06/2015 PROCEDURE:   Colonoscopy, surveillance and Colonoscopy with snare polypectomy First Screening Colonoscopy - Avg.  risk and is 50 yrs.  old or older - No.  Prior Negative Screening - Now for repeat screening. N/A  History of Adenoma - Now for follow-up colonoscopy & has been > or = to 3 yrs.  No.  It has been less than 3 yrs since last colonoscopy.  Medical reason.  Polyps removed today? 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  The digital rectal exam revealed no rectal mass.   The LB [id number]  endoscope was introduced through the anus and advanced to the cecum, which was identified by both the appendix and ileocecal valve. No adverse events experienced. The quality of the prep was good.  (Suprep was used)  The instrument was then slowly withdrawn as the colon was fully examined. Estimated blood loss is zero unless otherwise noted in this procedure report.      COLON FINDINGS: Five sessile polyps ranging between 3-73mm in size were found at the cecum and in the ascending colon.  Polypectomies were performed with a cold snare.  The resection was complete, the polyp tissue was completely retrieved and sent to histology.   Five sessile and pedunculated polyps ranging from 6 to 46mm in size were found in the transverse colon (1), descending colon (3),  and sigmoid colon (1).  Polypectomies were performed using snare cautery.  The resection was complete, the polyp tissue was completely retrieved and sent to histology. The base of one polypectomy was closed prophylactically by placing one Hemoclip. Five sessile polyps ancillary pedunculated ranging between 3-8mm in size were found in the transverse colon (1) and descending colon (4).  Polypectomies were performed with a cold snare.  The resection was complete, the polyp tissue was completely retrieved and sent to histology.   There was evidence of a normal appearing prior surgical anastomosis in the rectosigmoid colon.  There was evidence of pseudopolyps and hyperplastic appearing nodules near the anastomosis. No residual adenomatous appearing tissue seen in this location.  Retroflexion was not performed due to a narrow rectal vault but for view revealed small internal hemorrhoids. The time to cecum = 2.7 Withdrawal time = 30.5   The scope was withdrawn and the procedure completed.  COMPLICATIONS: There were no immediate complications.  ENDOSCOPIC IMPRESSION: 1.   Five sessile polyps ranging between 3-6mm in size were found at the cecum and in the ascending colon; polypectomies were performed with a cold snare 2.   Five sessile and pedunculated polyps ranging from 6 to 68mm in size were found in the transverse colon, descending colon, and sigmoid colon; polypectomies were performed using snare cautery 3.   Five sessile and semi-pedunculated polyps ranging between 3-58mm in size were found in the transverse colon and descending colon; polypectomies were performed with a cold snare 4.   There was evidence of prior surgical anastomosis  RECOMMENDATIONS: 1.  Hold Aspirin and all other NSAIDs for 2 weeks. 2.  Await pathology results 3.  Repeat Colonoscopy in 1 year. 4.  You will receive a letter within 1-2 weeks with the results of your biopsy as well as final recommendations.  Please  call my office if you have not received a letter after 3 weeks.  eSigned:  Jerene Bears, MD 09/06/2015 12:26 PM   cc:  the patient, PCP, Dr. Johney Maine   PATIENT NAME:  Carol, Dominguez MR#: [id number]

## 2015-09-06 NOTE — Patient Instructions (Signed)
Discharge instructions given. Handout on polyps. Hold aspirin and all other NSAIDS for 2 weeks. Resume previous medications. YOU HAD AN ENDOSCOPIC PROCEDURE TODAY AT [facility]:   Refer to the procedure report that was given to you for any specific questions about what was found during the examination.  If the procedure report does not answer your questions, please call your gastroenterologist to clarify.  If you requested that your care partner not be given the details of your procedure findings, then the procedure report has been included in a sealed envelope for you to review at your convenience later.  YOU SHOULD EXPECT: Some feelings of bloating in the abdomen. Passage of more gas than usual.  Walking can help get rid of the air that was put into your GI tract during the procedure and reduce the bloating. If you had a lower endoscopy (such as a colonoscopy or flexible sigmoidoscopy) you may notice spotting of blood in your stool or on the toilet paper. If you underwent a bowel prep for your procedure, you may not have a normal bowel movement for a few days.  Please Note:  You might notice some irritation and congestion in your nose or some drainage.  This is from the oxygen used during your procedure.  There is no need for concern and it should clear up in a day or so.  SYMPTOMS TO REPORT IMMEDIATELY:   Following lower endoscopy (colonoscopy or flexible sigmoidoscopy):  Excessive amounts of blood in the stool  Significant tenderness or worsening of abdominal pains  Swelling of the abdomen that is new, acute  Fever of 100F or higher  For urgent or emergent issues, a gastroenterologist can be reached at any hour by calling [phone].   DIET: Your first meal following the procedure should be a small meal and then it is ok to progress to your normal diet. Heavy or fried foods are harder to digest and may make you feel nauseous or bloated.  Likewise, meals heavy in dairy  and vegetables can increase bloating.  Drink plenty of fluids but you should avoid alcoholic beverages for 24 hours.  ACTIVITY:  You should plan to take it easy for the rest of today and you should NOT DRIVE or use heavy machinery until tomorrow (because of the sedation medicines used during the test).    FOLLOW UP: Our staff will call the number listed on your records the next business day following your procedure to check on you and address any questions or concerns that you may have regarding the information given to you following your procedure. If we do not reach you, we will leave a message.  However, if you are feeling well and you are not experiencing any problems, there is no need to return our call.  We will assume that you have returned to your regular daily activities without incident.  If any biopsies were taken you will be contacted by phone or by letter within the next 1-3 weeks.  Please call us at [phone] if you have not heard about the biopsies in 3 weeks.    SIGNATURES/CONFIDENTIALITY: You and/or your care partner have signed paperwork which will be entered into your electronic medical record.  These signatures attest to the fact that that the information above on your After Visit Summary has been reviewed and is understood.  Full responsibility of the confidentiality of this discharge information lies with you and/or your care-partner.

## 2015-09-07 ENCOUNTER — Telehealth: Payer: Self-pay

## 2015-09-07 NOTE — Telephone Encounter (Signed)
  Follow up Call-  Call back number 09/06/2015  Post procedure Call Back phone  # [phone]  Permission to leave phone message Yes     Patient questions:  Do you have a fever, pain , or abdominal swelling? No. Pain Score  0 *  Have you tolerated food without any problems? Yes.    Have you been able to return to your normal activities? Yes.    Do you have any questions about your discharge instructions: Diet   No. Medications  No. Follow up visit  No.  Do you have questions or concerns about your Care? No.  Actions: * If pain score is 4 or above: No action needed, pain <4.

## 2015-09-09 ENCOUNTER — Encounter: Payer: Self-pay | Admitting: Internal Medicine

## 2016-07-05 ENCOUNTER — Encounter: Payer: Self-pay | Admitting: Internal Medicine

## 2016-08-22 IMAGING — CT CT ABD-PELV W/ CM
2 of 5 series · 11 of 46 positions shown, 12 images · IV contrast (Iodine)
Comparison: None.

CLINICAL DATA: Intermittent rectal bleeding for 6 months, today
much worse.

EXAM:
CT ABDOMEN AND PELVIS WITH CONTRAST
TECHNIQUE: Multidetector CT imaging of the abdomen and pelvis was performed
using the standard protocol following bolus administration of
intravenous contrast.
CONTRAST:  100mL OMNIPAQUE IOHEXOL 300 MG/ML  SOLN

[Series 201: routine, idose (2) · axial · 0.78mm/px · z∈[-448,-123]mm · 8 of 83 slices shown, 9 images]
[im 9/83  soft-tissue]
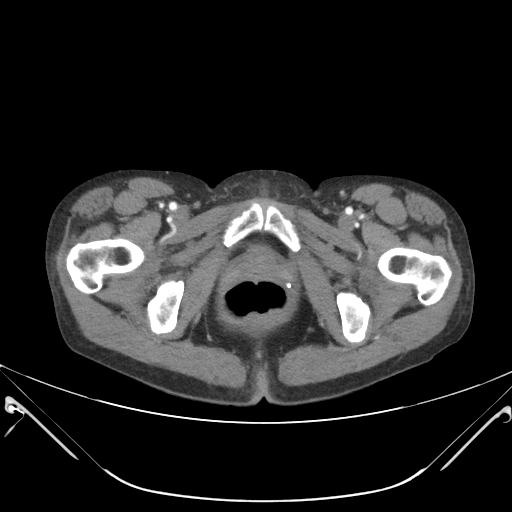
[im 9/83  bone]
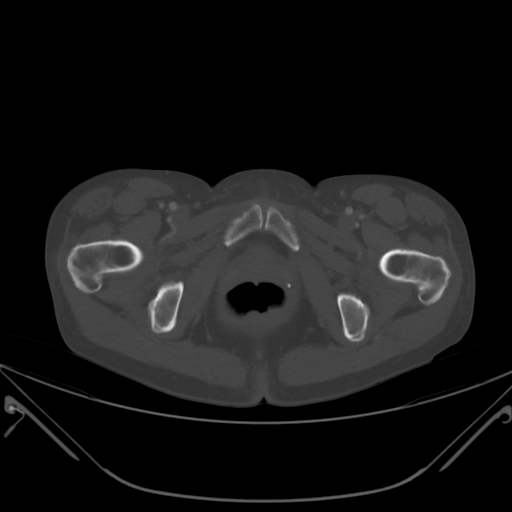
[im 17/83  soft-tissue]
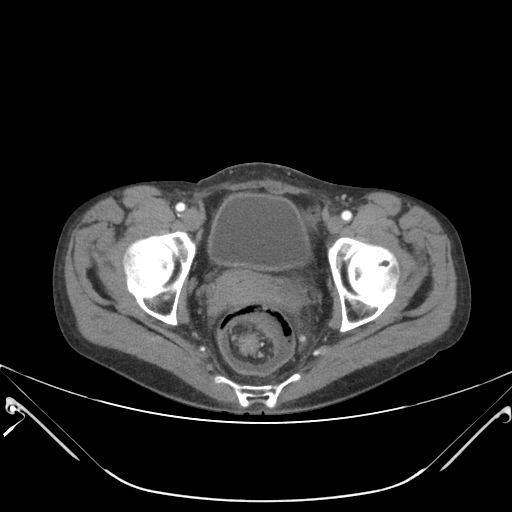
[im 25/83  soft-tissue]
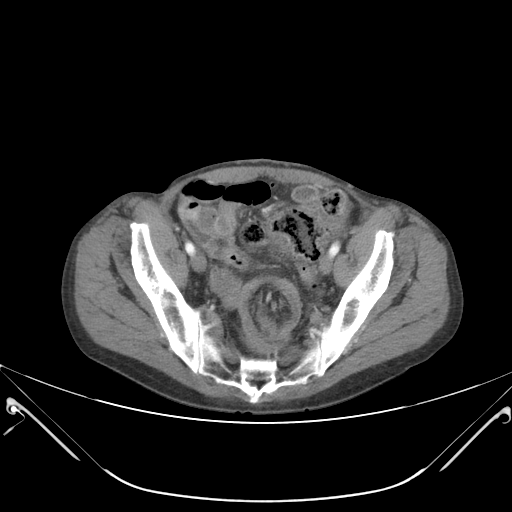
[im 37/83  soft-tissue]
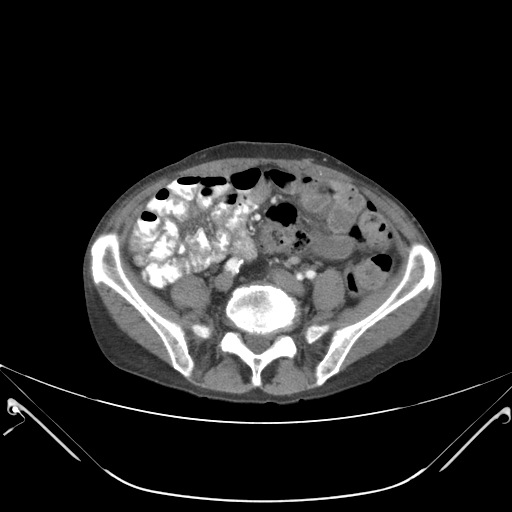
[im 46/83  soft-tissue]
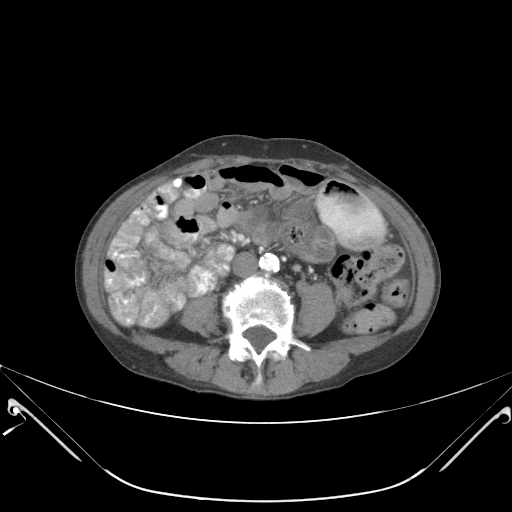
[im 58/83  soft-tissue]
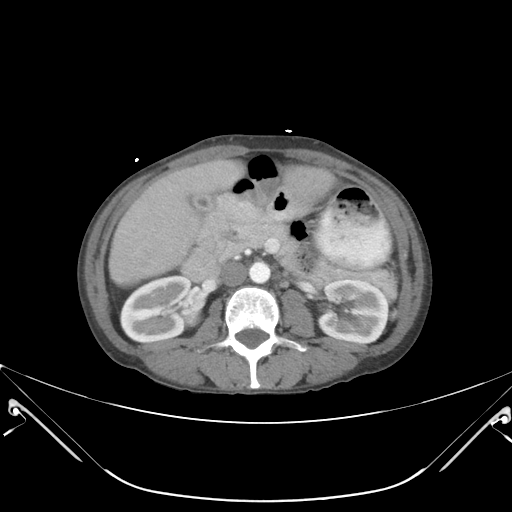
[im 66/83  soft-tissue]
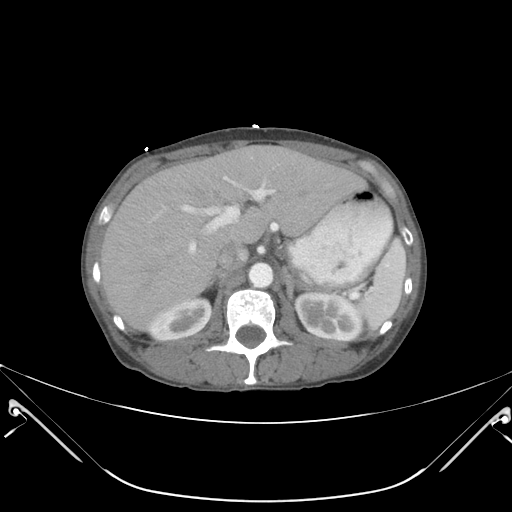
[im 74/83  soft-tissue]
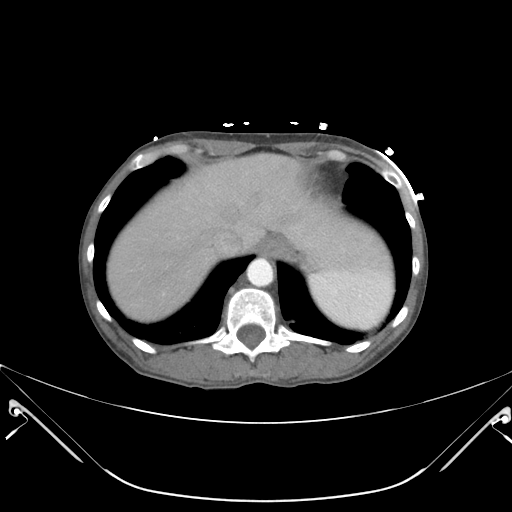

[Series 203: coronals, idose (2) · coronal · 0.45mm/px · 3 of 89 slices shown]
[im 30/89  soft-tissue]
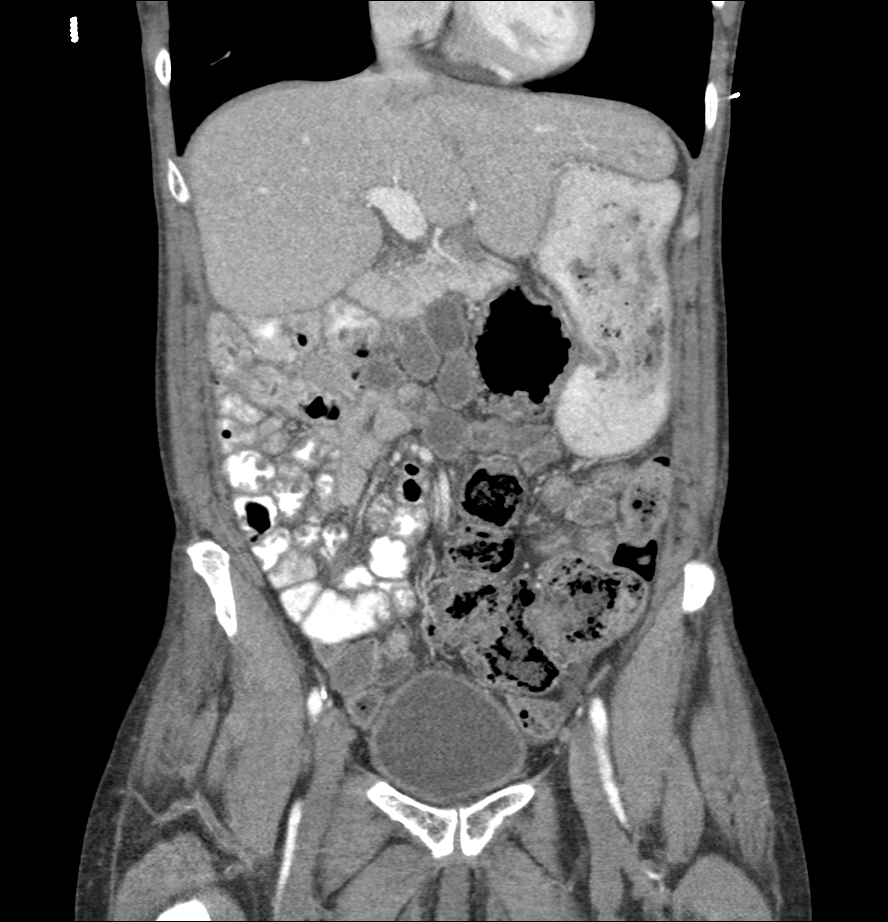
[im 40/89  soft-tissue]
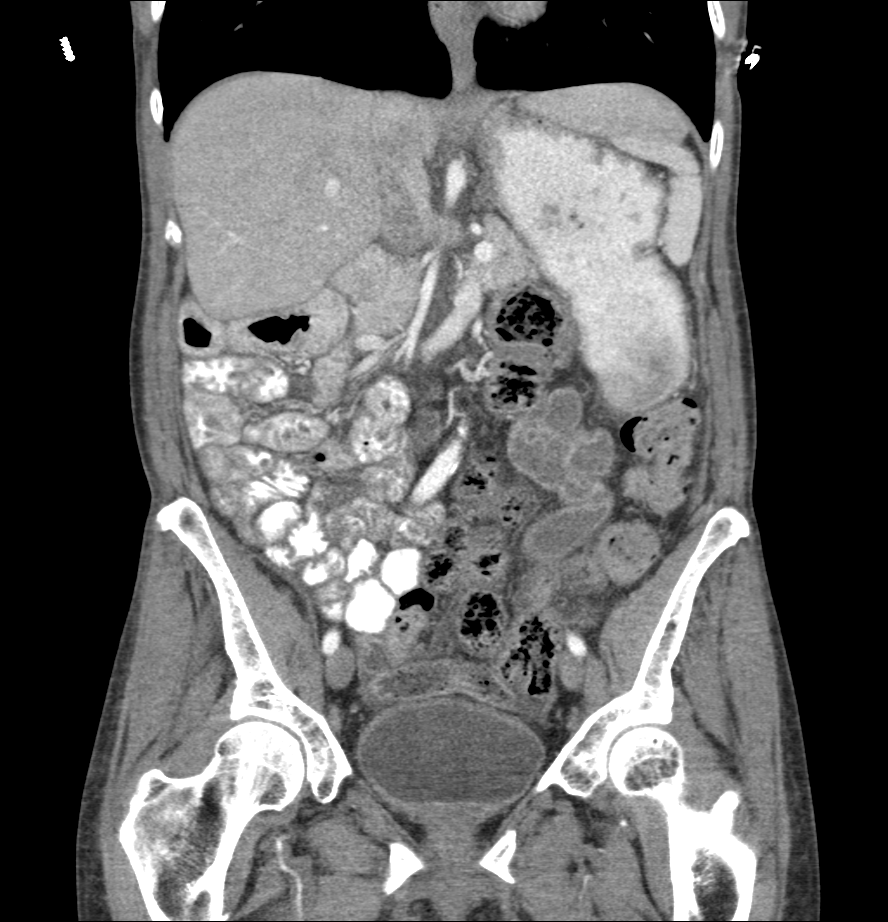
[im 49/89  soft-tissue]
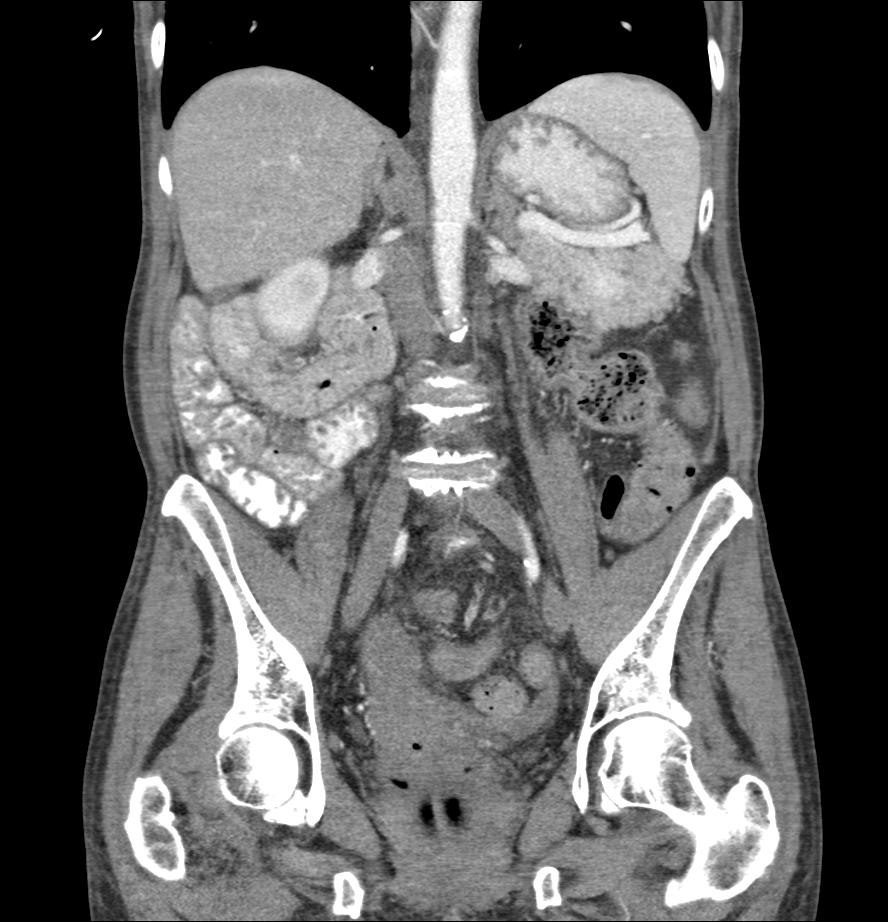

[11 of 46 positions shown; findings below may reference images not displayed]

FINDINGS: There is a colocolic intussusception of the sigmoid into the rectum.
At the leading edge of the intussusception there is a 3 cm enhancing
soft tissue lesion which may represent a neoplasm. There is no
significant bowel obstruction associated with the intussusception.
There appears to be a congenital malrotation anomaly with all of the
colon in the left abdomen and the small bowel in the right abdomen.
There is no extraluminal air. There is no pneumatosis.

There are normal appearances of the liver, spleen, pancreas,
adrenals and kidneys. The abdominal aorta is normal in caliber with
moderate atherosclerotic calcification. There is mild posteromedial
left lower lobe pleural parenchymal opacity which probably
represents scarring. There is no significant skeletal lesion.
Moderate lumbar degenerative disc changes are present from L 3
through L5.
IMPRESSION: 1. Intussusception of the sigmoid into the rectum, with abnormal
enhancing soft tissue at the leading edge of the intussusception
suspicious for a 3 cm neoplasm. These results were called by
telephone at the time of interpretation on 04/05/2015 at [DATE] to
Dr. XHEVAHIRI SARJA , who verbally acknowledged these results.
2. No significant bowel obstruction associated with the
intussusception.
3. Congenital malrotation anomaly with all of the colon in the left
abdomen and small bowel in the right abdomen.

## 2016-09-22 ENCOUNTER — Observation Stay (HOSPITAL_COMMUNITY)
Admission: EM | Admit: 2016-09-22 | Discharge: 2016-09-24 | Disposition: A | Discharge status: Home / Self Care | Payer: Medicare HMO | Attending: Nephrology | Admitting: Nephrology

## 2016-09-22 ENCOUNTER — Encounter (HOSPITAL_COMMUNITY): Payer: Self-pay | Admitting: Nurse Practitioner

## 2016-09-22 ENCOUNTER — Emergency Department (HOSPITAL_COMMUNITY): Payer: Medicare HMO

## 2016-09-22 DIAGNOSIS — F17210 Nicotine dependence, cigarettes, uncomplicated: Secondary | ICD-10-CM | POA: Diagnosis not present

## 2016-09-22 DIAGNOSIS — Z85038 Personal history of other malignant neoplasm of large intestine: Secondary | ICD-10-CM | POA: Insufficient documentation

## 2016-09-22 DIAGNOSIS — J189 Pneumonia, unspecified organism: Principal | ICD-10-CM | POA: Diagnosis present

## 2016-09-22 DIAGNOSIS — Z79899 Other long term (current) drug therapy: Secondary | ICD-10-CM | POA: Diagnosis not present

## 2016-09-22 DIAGNOSIS — N179 Acute kidney failure, unspecified: Secondary | ICD-10-CM | POA: Diagnosis present

## 2016-09-22 DIAGNOSIS — R509 Fever, unspecified: Secondary | ICD-10-CM | POA: Diagnosis present

## 2016-09-22 DIAGNOSIS — E860 Dehydration: Secondary | ICD-10-CM | POA: Diagnosis present

## 2016-09-22 DIAGNOSIS — J45909 Unspecified asthma, uncomplicated: Secondary | ICD-10-CM | POA: Diagnosis not present

## 2016-09-22 DIAGNOSIS — J1100 Influenza due to unidentified influenza virus with unspecified type of pneumonia: Secondary | ICD-10-CM

## 2016-09-22 LAB — URINALYSIS, ROUTINE W REFLEX MICROSCOPIC
Bilirubin Urine: NEGATIVE
GLUCOSE, UA: NEGATIVE mg/dL
Ketones, ur: 5 mg/dL — AB
NITRITE: POSITIVE — AB
PH: 5 (ref 5.0–8.0)
Protein, ur: 100 mg/dL — AB
Specific Gravity, Urine: 1.02 (ref 1.005–1.030)

## 2016-09-22 LAB — BILIRUBIN, DIRECT: BILIRUBIN DIRECT: 0.8 mg/dL — AB (ref 0.1–0.5)

## 2016-09-22 LAB — CREATININE, URINE, RANDOM: Creatinine, Urine: 63.71 mg/dL

## 2016-09-22 LAB — COMPREHENSIVE METABOLIC PANEL
ALK PHOS: 79 U/L (ref 38–126)
ALT: 77 U/L — ABNORMAL HIGH (ref 14–54)
AST: 120 U/L — ABNORMAL HIGH (ref 15–41)
Albumin: 3.2 g/dL — ABNORMAL LOW (ref 3.5–5.0)
Anion gap: 13 (ref 5–15)
BILIRUBIN TOTAL: 1.7 mg/dL — AB (ref 0.3–1.2)
BUN: 17 mg/dL (ref 6–20)
CALCIUM: 8.8 mg/dL — AB (ref 8.9–10.3)
CO2: 23 mmol/L (ref 22–32)
CREATININE: 1.43 mg/dL — AB (ref 0.44–1.00)
Chloride: 97 mmol/L — ABNORMAL LOW (ref 101–111)
GFR, EST AFRICAN AMERICAN: 44 mL/min — AB (ref >60)
GFR, EST NON AFRICAN AMERICAN: 38 mL/min — AB (ref >60)
Glucose, Bld: 118 mg/dL — ABNORMAL HIGH (ref 65–99)
Potassium: 4.5 mmol/L (ref 3.5–5.1)
Sodium: 133 mmol/L — ABNORMAL LOW (ref 135–145)
TOTAL PROTEIN: 7.2 g/dL (ref 6.5–8.1)

## 2016-09-22 LAB — CBC
HCT: 40.9 % (ref 36.0–46.0)
HEMATOCRIT: 38.9 % (ref 36.0–46.0)
HEMOGLOBIN: 13.5 g/dL (ref 12.0–15.0)
Hemoglobin: 14.3 g/dL (ref 12.0–15.0)
MCH: 30 pg (ref 26.0–34.0)
MCH: 29.9 pg (ref 26.0–34.0)
MCHC: 35 g/dL (ref 30.0–36.0)
MCHC: 34.7 g/dL (ref 30.0–36.0)
MCV: 85.7 fL (ref 78.0–100.0)
MCV: 86.1 fL (ref 78.0–100.0)
Platelets: 202 10*3/uL (ref 150–400)
Platelets: 176 10*3/uL (ref 150–400)
RBC: 4.77 MIL/uL (ref 3.87–5.11)
RBC: 4.52 MIL/uL (ref 3.87–5.11)
RDW: 13.4 % (ref 11.5–15.5)
RDW: 13.9 % (ref 11.5–15.5)
WBC: 7.9 10*3/uL (ref 4.0–10.5)
WBC: 7.5 10*3/uL (ref 4.0–10.5)

## 2016-09-22 LAB — I-STAT TROPONIN, ED: Troponin i, poc: 0.01 ng/mL (ref 0.00–0.08)

## 2016-09-22 LAB — CREATININE, SERUM
Creatinine, Ser: 1.25 mg/dL — ABNORMAL HIGH (ref 0.44–1.00)
GFR calc non Af Amer: 45 mL/min — ABNORMAL LOW (ref >60)
GFR, EST AFRICAN AMERICAN: 52 mL/min — AB (ref >60)

## 2016-09-22 LAB — INFLUENZA PANEL BY PCR (TYPE A & B)
Influenza A By PCR: NEGATIVE
Influenza B By PCR: POSITIVE — AB

## 2016-09-22 LAB — PHOSPHORUS: PHOSPHORUS: 2.6 mg/dL (ref 2.5–4.6)

## 2016-09-22 LAB — CBG MONITORING, ED: GLUCOSE-CAPILLARY: 238 mg/dL — AB (ref 65–99)

## 2016-09-22 LAB — LIPASE, BLOOD: LIPASE: 21 U/L (ref 11–51)

## 2016-09-22 LAB — MRSA PCR SCREENING: MRSA BY PCR: NEGATIVE

## 2016-09-22 LAB — SODIUM, URINE, RANDOM: SODIUM UR: 37 mmol/L

## 2016-09-22 LAB — MAGNESIUM: MAGNESIUM: 2.1 mg/dL (ref 1.7–2.4)

## 2016-09-22 MED ORDER — ACETAMINOPHEN 650 MG RE SUPP
650.0000 mg | Freq: Four times a day (QID) | RECTAL | Status: DC | PRN
Start: 2016-09-22 — End: 2016-09-24

## 2016-09-22 MED ORDER — DEXTROSE 5 % IV SOLN
1.0000 g | Freq: Once | INTRAVENOUS | Status: AC
  Administered 2016-09-22: 1 g via INTRAVENOUS
  Filled 2016-09-22: qty 10

## 2016-09-22 MED ORDER — SODIUM CHLORIDE 0.9 % IV SOLN
INTRAVENOUS | Status: AC
  Administered 2016-09-22 – 2016-09-23 (×2): via INTRAVENOUS

## 2016-09-22 MED ORDER — GUAIFENESIN ER 600 MG PO TB12
600.0000 mg | ORAL_TABLET | Freq: Two times a day (BID) | ORAL | Status: DC
  Administered 2016-09-22 – 2016-09-24 (×5): 600 mg via ORAL
  Filled 2016-09-22 (×5): qty 1

## 2016-09-22 MED ORDER — ENOXAPARIN SODIUM 40 MG/0.4ML ~~LOC~~ SOLN
40.0000 mg | SUBCUTANEOUS | Status: DC
  Administered 2016-09-22 – 2016-09-23 (×2): 40 mg via SUBCUTANEOUS
  Filled 2016-09-22 (×3): qty 0.4

## 2016-09-22 MED ORDER — SODIUM CHLORIDE 0.9 % IV BOLUS (SEPSIS)
1000.0000 mL | Freq: Once | INTRAVENOUS | Status: AC
  Administered 2016-09-22: 1000 mL via INTRAVENOUS

## 2016-09-22 MED ORDER — ALBUTEROL SULFATE (2.5 MG/3ML) 0.083% IN NEBU: 2.5000 mg | INHALATION_SOLUTION | RESPIRATORY_TRACT | Status: DC | PRN

## 2016-09-22 MED ORDER — VANCOMYCIN HCL IN DEXTROSE 1-5 GM/200ML-% IV SOLN
1000.0000 mg | Freq: Once | INTRAVENOUS | Status: AC
  Administered 2016-09-22: 1000 mg via INTRAVENOUS
  Filled 2016-09-22: qty 200

## 2016-09-22 MED ORDER — ACETAMINOPHEN 325 MG PO TABS
650.0000 mg | ORAL_TABLET | Freq: Four times a day (QID) | ORAL | Status: DC | PRN
  Administered 2016-09-22: 650 mg via ORAL
  Filled 2016-09-22: qty 2

## 2016-09-22 MED ORDER — ONDANSETRON HCL 4 MG/2ML IJ SOLN: 4.0000 mg | Freq: Once | INTRAMUSCULAR | Status: DC

## 2016-09-22 MED ORDER — ALBUTEROL SULFATE (2.5 MG/3ML) 0.083% IN NEBU
2.5000 mg | INHALATION_SOLUTION | Freq: Once | RESPIRATORY_TRACT | Status: AC
  Administered 2016-09-22: 2.5 mg via RESPIRATORY_TRACT
  Filled 2016-09-22: qty 3

## 2016-09-22 MED ORDER — IPRATROPIUM-ALBUTEROL 0.5-2.5 (3) MG/3ML IN SOLN
3.0000 mL | Freq: Once | RESPIRATORY_TRACT | Status: AC
  Administered 2016-09-22: 3 mL via RESPIRATORY_TRACT
  Filled 2016-09-22: qty 3

## 2016-09-22 MED ORDER — IPRATROPIUM-ALBUTEROL 0.5-2.5 (3) MG/3ML IN SOLN
3.0000 mL | Freq: Four times a day (QID) | RESPIRATORY_TRACT | Status: DC
  Administered 2016-09-22 (×2): 3 mL via RESPIRATORY_TRACT
  Filled 2016-09-22 (×2): qty 3

## 2016-09-22 MED ORDER — DEXTROSE 5 % IV SOLN
500.0000 mg | Freq: Once | INTRAVENOUS | Status: AC
  Administered 2016-09-22: 500 mg via INTRAVENOUS
  Filled 2016-09-22: qty 500

## 2016-09-22 MED ORDER — DEXTROSE 5 % IV SOLN
1.0000 g | Freq: Once | INTRAVENOUS | Status: AC
  Administered 2016-09-23: 1 g via INTRAVENOUS
  Filled 2016-09-22: qty 10

## 2016-09-22 NOTE — ED Notes (Signed)
Patient up to use the BR.

## 2016-09-22 NOTE — Progress Notes (Signed)
New Admission Note: transfer from ED  Arrival Method: stretcher Mental Orientation: a/o x4 Telemetry: placed Assessment: Completed Skin: clean dry intact IV: LFA infusing anitbiotic Pain:pain on Rside of chest Tubes: none Safety Measures: Safety Fall Prevention Plan has been given, discussed and signed Admission: Completed Unit Orientation: Patient has been orientated to the room, unit and staff.  Family: not present  Orders have been reviewed and implemented. Will continue to monitor the patient. Call light has been placed within reach and bed alarm has been activated.   Retta Mac BSN, RN

## 2016-09-22 NOTE — H&P (Signed)
[facility] History and Physical  Carol Dominguez [id number] DOB: April 11, 1951 DOA: 09/22/2016  Referring physician:  PCP: [name], MD   Chief Complaint: "I started getting short of breath."  HPI: Carol Dominguez is a 66 y.o. female with past medical history significant for asthma, colon cancer in remission for 1 year, tobacco abuse and history of tuberculosis in the 66s who presented with shortness of breath. Patient states she had flulike symptoms are weak. Then began to have symptoms of shortness of breath last Wednesday. Shortness of breath got worse over the last few days. Patient had decreased appetite so was not eating and drinking properly. Patient also has had diarrhea 4-5 times a day for the last week.  ED course: Patient given azithromycin, as Rocephin and a liter of fluid. Patient also given a DuoNeb and 1 albuterol treatment. Patient was able to maintain her oxygen saturation off of nasal cannula but labs revealed an acute kidney injury. Hospitalist consulted for admission.   Review of Systems:  As per HPI otherwise 10 point review of systems negative.    Past Medical History:  Diagnosis Date  . Anemia   . Asthma   . Cancer ([place]) 04/2015   colon cancer   . Depression   . Foot fracture, right   . Iron deficiency   . Ovarian cyst 1975   [facility]  . Teeth decayed   . Tobacco use   . Tuberculosis 1973   Past Surgical History:  Procedure Laterality Date  . CYST EXCISION Bilateral 1973   Right ac and left wrist   . DILATION AND CURETTAGE OF UTERUS    . INGUINAL HERNIA REPAIR Right    93-22 y/o - Dr. Ronnald Ramp  . LAPAROSCOPIC LOW ANTERIOR RESECTION N/A 04/09/2015   Procedure: LAPAROSCOPIC LOW ANTERIOR RESECTION;  Surgeon: Michael Boston, MD;  Location: [facility];  Service: General;  Laterality: N/A;  . OVARIAN CYST REMOVAL Right 1975   Dr. Ronnald Ramp  . RECTOPEXY N/A 04/09/2015   Procedure: RECTOPEXY;  Surgeon: Michael Boston, MD;  Location: [facility];  Service:  General;  Laterality: N/A;   Social History:  reports that she has been smoking Cigarettes.  She has been smoking about 0.25 packs per day. She has never used smokeless tobacco. She reports that she does not drink alcohol or use drugs.  Allergies  Allergen Reactions  . Codeine     "Makes me sick."    Family History  Problem Relation Age of Onset  . Colon cancer Neg Hx      Prior to Admission medications   Medication Sig Start Date End Date Taking? Authorizing Provider  albuterol (PROVENTIL HFA;VENTOLIN HFA) 108 (90 BASE) MCG/ACT inhaler Inhale 2 puffs into the lungs every 6 (six) hours as needed for wheezing or shortness of breath.   Yes Historical Provider, MD  guaifenesin (ROBITUSSIN) 100 MG/5ML syrup Take 200 mg by mouth 3 (three) times daily as needed for cough.   Yes Historical Provider, MD  Phenylephrine-Pheniramine-DM ([place]) 05-27-19 MG PACK Take 1 packet by mouth daily as needed (flu symptoms).   Yes Historical Provider, MD   Physical Exam: Vitals:   09/22/16 1415 09/22/16 1430 09/22/16 1434 09/22/16 1515  BP: 135/75 148/85 148/85 145/75  Pulse: 89 98 95 102  Resp:   18   Temp:      TempSrc:      SpO2: 96% 96% 95% 94%    Wt Readings from Last 3 Encounters:  09/06/15 55.3  kg (122 lb)  08/26/15 55.3 kg (122 lb)  06/25/15 53.5 kg (118 lb)    General:  Appears calm and comfortable, Alert and oriented 3 Eyes:  PERRL, EOMI, normal lids, iris ENT:  grossly normal hearing, lips & tongue Neck:  no LAD, masses or thyromegaly Cardiovascular:  RRR, no m/r/g. No LE edema.  Respiratory:  Mild increased work of breathing, rhonchi positive, no wheezes, scattered rales Abdomen:  soft, ntnd Skin:  no rash or induration seen on limited exam Musculoskeletal:  grossly normal tone BUE/BLE Psychiatric:  grossly normal mood and affect, speech fluent and appropriate Neurologic:  CN 2-12 grossly intact, moves all extremities in coordinated fashion.          Labs on  Admission:  Basic Metabolic Panel:  Recent Labs Lab 09/22/16 1134  NA 133*  K 4.5  CL 97*  CO2 23  GLUCOSE 118*  BUN 17  CREATININE 1.43*  CALCIUM 8.8*   Liver Function Tests:  Recent Labs Lab 09/22/16 1134  AST 120*  ALT 77*  ALKPHOS 79  BILITOT 1.7*  PROT 7.2  ALBUMIN 3.2*    Recent Labs Lab 09/22/16 1134  LIPASE 21   No results for input(s): AMMONIA in the last 168 hours. CBC:  Recent Labs Lab 09/22/16 1134  WBC 7.9  HGB 14.3  HCT 40.9  MCV 85.7  PLT 202   Cardiac Enzymes: No results for input(s): CKTOTAL, CKMB, CKMBINDEX, TROPONINI in the last 168 hours.  BNP (last 3 results) No results for input(s): BNP in the last 8760 hours.  ProBNP (last 3 results) No results for input(s): PROBNP in the last 8760 hours.   Creatinine clearance cannot be calculated (Unknown ideal weight.)  CBG: No results for input(s): GLUCAP in the last 168 hours.  Radiological Exams on Admission: Dg Chest 2 View  Result Date: 09/22/2016 CLINICAL DATA:  Cough with shortness of breath and fever EXAM: CHEST  2 VIEW COMPARISON:  April 08, 2015 FINDINGS: There is airspace consolidation in the posterior segment right upper lobe consistent with pneumonia. There is underlying mild generalized interstitial prominence, likely due to chronic inflammatory type change. Heart size and pulmonary vascularity are normal. No adenopathy. There is atherosclerotic calcification in the aorta. No evident bone lesions. IMPRESSION: Posterior segment right upper lobe pneumonia. Stable underlying interstitial prominence in a somewhat generalized manner, likely reflecting chronic inflammatory type change. Followup PA and lateral chest radiographs recommended in 3-4 weeks following trial of antibiotic therapy to ensure resolution and exclude underlying malignancy. Electronically Signed   By: Lowella Grip III M.D.   On: 09/22/2016 12:07    EKG: Independently reviewed. No STEMI. No acute ST changes  when compared to September 2016 EKG.  Assessment/Plan Active Problems:   AKI (acute kidney injury) ([place])   CAP (community acquired pneumonia)   Dehydration   AKI Baseline Cr <1.1, Cr on admit 1.43 1037ml of normal saline given in the emergency room Gentle hydration overnight Checking magnesium and phosphorus Urine labs ordered to calculate fractional excretion of Na Likely related to low PO intake, GI loses and resp issue. Combo of ATN and prerenal.  Dehydration Gentle hydration  CAP Scheduled DuoNeb's When necessary albuterol Antibiotic- Vanc, azithro, rocephin; added MRSA swab if neg will not need need  Oxygen therapy Continuous pulse oximetry  Code Status: FULL CODE  DVT Prophylaxis: lovenox Family Communication: no family at bedside Disposition Plan: Pending Improvement  Status: obs tele  Elwin Mocha, MD Family Medicine [facility] [email or web address] Password  [email or web address]

## 2016-09-22 NOTE — ED Provider Notes (Signed)
[place] DEPT Provider Note   CSN: [id number] Arrival date & time: 09/22/16  1118     History   Chief Complaint Chief Complaint  Patient presents with  . Influenza    HPI Carol Dominguez is a 66 y.o. female.  Pt is a 67 y/o F with PMH of asthma, anemia, depression, and colon cancer (reports she is in remission; no active tx currently) who presents to ED for subjective fevers, chills, body aches, nonproductive cough, SOB, nausea, and epigastric abd pain, onset approx 1 week ago but progressively worse, has tried Theraflu without significant relief. +decreased appetite. Also reports midsternal Cp, onset approx 5 days ago, occurs only with cough, no radiation, describes as sharp. Denies unexplained weight loss, dizziness, vision or gait changes, headache, neck pain, hemoptysis, vomiting, diarrhea, dysuria, extremity weakness, or any additional concerns. No recent travel or known sick contacts.    The history is provided by the patient. No language interpreter was used.    Past Medical History:  Diagnosis Date  . Anemia   . Asthma   . Cancer ([place]) 04/2015   colon cancer   . Depression   . Foot fracture, right   . Iron deficiency   . Ovarian cyst 1975   [facility]  . Teeth decayed   . Tobacco use   . Tuberculosis 1973    Patient Active Problem List   Diagnosis Date Noted  . Protein-calorie malnutrition, moderate ([place]) 04/14/2015  . Claustrophobia 04/09/2015  . Internal complete rectal prolapse with intussusception of rectosigmoid 04/09/2015  . Tobacco use   . Rectal prolapse 04/08/2015  . Intussusception of colon mass into rectum with obstruction s/p lap LAR / rectopexy 04/09/2015 04/08/2015    Past Surgical History:  Procedure Laterality Date  . CYST EXCISION Bilateral 1973   Right ac and left wrist   . DILATION AND CURETTAGE OF UTERUS    . INGUINAL HERNIA REPAIR Right    41-22 y/o - Dr. Ronnald Ramp  . LAPAROSCOPIC LOW ANTERIOR RESECTION N/A 04/09/2015   Procedure:  LAPAROSCOPIC LOW ANTERIOR RESECTION;  Surgeon: Michael Boston, MD;  Location: [facility];  Service: General;  Laterality: N/A;  . OVARIAN CYST REMOVAL Right 1975   Dr. Ronnald Ramp  . RECTOPEXY N/A 04/09/2015   Procedure: RECTOPEXY;  Surgeon: Michael Boston, MD;  Location: [facility];  Service: General;  Laterality: N/A;    OB History    No data available       Home Medications    Prior to Admission medications   Medication Sig Start Date End Date Taking? Authorizing Provider  albuterol (PROVENTIL HFA;VENTOLIN HFA) 108 (90 BASE) MCG/ACT inhaler Inhale 2 puffs into the lungs every 6 (six) hours as needed for wheezing or shortness of breath.    Historical Provider, MD  docusate sodium (COLACE) 100 MG capsule Take 1 capsule (100 mg total) by mouth 2 (two) times daily as needed for mild constipation. 04/13/15   Nat Christen, PA-C  ibuprofen (ADVIL,MOTRIN) 400 MG tablet Take 1 tablet (400 mg total) by mouth every 8 (eight) hours as needed for moderate pain. Take with food to avoid stomach upset. 04/13/15   Nat Christen, PA-C  methocarbamol (ROBAXIN) 500 MG tablet Take 2 tablets (1,000 mg total) by mouth every 8 (eight) hours as needed for muscle spasms. 04/13/15   Nat Christen, PA-C  oxyCODONE-acetaminophen (PERCOCET/ROXICET) 5-325 MG per tablet Take 1-2 tablets by mouth every 6 (six) hours as needed for moderate pain or severe pain. 04/13/15  Nat Christen, PA-C  psyllium (METAMUCIL SMOOTH TEXTURE) 58.6 % powder Take 1 packet by mouth 2 (two) times daily as needed. 04/13/15   Nat Christen, PA-C  saccharomyces boulardii (FLORASTOR) 250 MG capsule Take 1 capsule (250 mg total) by mouth 2 (two) times daily. Patient not taking: Reported on 08/26/2015 04/13/15   Nat Christen, PA-C    Family History Family History  Problem Relation Age of Onset  . Colon cancer Neg Hx     Social History Social History  Substance Use Topics  . Smoking status: Current Every Day Smoker    Packs/day: 0.25    Types: Cigarettes  .  Smokeless tobacco: Never Used  . Alcohol use No     Allergies   Codeine   Review of Systems Review of Systems  Constitutional: Positive for appetite change, chills and fever. Negative for unexpected weight change.  HENT: Positive for congestion and sore throat. Negative for ear pain, trouble swallowing and voice change.   Eyes: Negative for photophobia, pain and visual disturbance.  Respiratory: Positive for cough and shortness of breath.   Cardiovascular: Positive for chest pain. Negative for palpitations and leg swelling.  Gastrointestinal: Positive for abdominal pain and nausea. Negative for diarrhea and vomiting.  Genitourinary: Negative for dysuria and hematuria.  Musculoskeletal: Negative for back pain, neck pain and neck stiffness.  Skin: Negative for rash.  Neurological: Negative for dizziness, weakness, numbness and headaches.     Physical Exam Updated Vital Signs BP 139/86   Pulse 90   Temp 99 F (37.2 C) (Oral)   Resp 20   SpO2 96%   Physical Exam  Constitutional: She is oriented to person, place, and time. She appears well-developed and well-nourished.  HENT:  Head: Normocephalic and atraumatic.  Right Ear: Tympanic membrane, external ear and ear canal normal. Tympanic membrane is not erythematous.  Left Ear: Tympanic membrane, external ear and ear canal normal. Tympanic membrane is not erythematous.  Nose: Nose normal.  Mouth/Throat: Uvula is midline. Mucous membranes are dry. No trismus in the jaw. No dental abscesses. Posterior oropharyngeal erythema present. No oropharyngeal exudate or tonsillar abscesses.  Eyes: Conjunctivae and EOM are normal. Pupils are equal, round, and reactive to light.  Neck: Normal range of motion and full passive range of motion without pain. Neck supple. No neck rigidity.  Cardiovascular: Normal rate, regular rhythm and intact distal pulses.  Exam reveals no gallop and no friction rub.   No murmur heard. Pulmonary/Chest: Effort  normal. She has no decreased breath sounds. She has no wheezes. She has rhonchi. She has no rales.  Abdominal: Soft. Bowel sounds are normal. There is no tenderness. There is no rebound and no guarding.  Musculoskeletal: Normal range of motion.  Neurological: She is alert and oriented to person, place, and time.  Skin: Skin is warm and dry.  Psychiatric: She has a normal mood and affect.  Nursing note and vitals reviewed.    ED Treatments / Results  Labs (all labs ordered are listed, but only abnormal results are displayed) Labs Reviewed  COMPREHENSIVE METABOLIC PANEL - Abnormal; Notable for the following:       Result Value   Sodium 133 (*)    Chloride 97 (*)    Glucose, Bld 118 (*)    Creatinine, Ser 1.43 (*)    Calcium 8.8 (*)    Albumin 3.2 (*)    AST 120 (*)    ALT 77 (*)    Total Bilirubin 1.7 (*)  GFR calc non Af Amer 38 (*)    GFR calc Af Amer 44 (*)    All other components within normal limits  CBC  URINALYSIS, ROUTINE W REFLEX MICROSCOPIC  I-STAT TROPOININ, ED    EKG  EKG Interpretation None       Radiology Dg Chest 2 View  Result Date: 09/22/2016 CLINICAL DATA:  Cough with shortness of breath and fever EXAM: CHEST  2 VIEW COMPARISON:  April 08, 2015 FINDINGS: There is airspace consolidation in the posterior segment right upper lobe consistent with pneumonia. There is underlying mild generalized interstitial prominence, likely due to chronic inflammatory type change. Heart size and pulmonary vascularity are normal. No adenopathy. There is atherosclerotic calcification in the aorta. No evident bone lesions. IMPRESSION: Posterior segment right upper lobe pneumonia. Stable underlying interstitial prominence in a somewhat generalized manner, likely reflecting chronic inflammatory type change. Followup PA and lateral chest radiographs recommended in 3-4 weeks following trial of antibiotic therapy to ensure resolution and exclude underlying malignancy.  Electronically Signed   By: Lowella Grip III M.D.   On: 09/22/2016 12:07    Procedures Procedures (including critical care time)  Medications Ordered in ED Medications - No data to display   Initial Impression / Assessment and Plan / ED Course  I have reviewed the triage vital signs and the nursing notes.  Pertinent labs & imaging results that were available during my care of the patient were reviewed by me and considered in my medical decision making (see chart for details).    Pt is a 66 y/o F who presents to ED for flu-like symptoms, concern for infleunza vs pna vs. asthma exacerbation. Doubt PE at this time. Pending CXR, CBC, & CMP; plan for breathing tx and re-eval.  1:33 PM CXR with R upper lobe PNA. Antibiotics ordered. Will continue to monitor.  2:40 PM Ambulatory O2 sat performed- 93-94%, reports more winded with ambulation. Will plan to admit, hospitalist paged.   Final Clinical Impressions(s) / ED Diagnoses   Final diagnoses:  None    New Prescriptions New Prescriptions   No medications on file     Ulice Bold, NP 09/22/16 [place], MD 09/23/16 [phone]

## 2016-09-22 NOTE — ED Notes (Signed)
Pt given ice cream per Estill Batten, RN

## 2016-09-22 NOTE — ED Provider Notes (Signed)
Medical screening examination/treatment/procedure(s) were conducted as a shared visit with non-physician practitioner(s) and myself.  I personally evaluated the patient during the encounter.   EKG Interpretation None       Results for orders placed or performed during the hospital encounter of 09/22/16  CBC  Result Value Ref Range   WBC 7.9 4.0 - 10.5 K/uL   RBC 4.77 3.87 - 5.11 MIL/uL   Hemoglobin 14.3 12.0 - 15.0 g/dL   HCT 40.9 36.0 - 46.0 %   MCV 85.7 78.0 - 100.0 fL   MCH 30.0 26.0 - 34.0 pg   MCHC 35.0 30.0 - 36.0 g/dL   RDW 13.4 11.5 - 15.5 %   Platelets 202 150 - 400 K/uL  Comprehensive metabolic panel  Result Value Ref Range   Sodium 133 (L) 135 - 145 mmol/L   Potassium 4.5 3.5 - 5.1 mmol/L   Chloride 97 (L) 101 - 111 mmol/L   CO2 23 22 - 32 mmol/L   Glucose, Bld 118 (H) 65 - 99 mg/dL   BUN 17 6 - 20 mg/dL   Creatinine, Ser 1.43 (H) 0.44 - 1.00 mg/dL   Calcium 8.8 (L) 8.9 - 10.3 mg/dL   Total Protein 7.2 6.5 - 8.1 g/dL   Albumin 3.2 (L) 3.5 - 5.0 g/dL   AST 120 (H) 15 - 41 U/L   ALT 77 (H) 14 - 54 U/L   Alkaline Phosphatase 79 38 - 126 U/L   Total Bilirubin 1.7 (H) 0.3 - 1.2 mg/dL   GFR calc non Af Amer 38 (L) >60 mL/min   GFR calc Af Amer 44 (L) >60 mL/min   Anion gap 13 5 - 15  Urinalysis, Routine w reflex microscopic  Result Value Ref Range   Color, Urine AMBER (A) YELLOW   APPearance HAZY (A) CLEAR   Specific Gravity, Urine 1.020 1.005 - 1.030   pH 5.0 5.0 - 8.0   Glucose, UA NEGATIVE NEGATIVE mg/dL   Hgb urine dipstick MODERATE (A) NEGATIVE   Bilirubin Urine NEGATIVE NEGATIVE   Ketones, ur 5 (A) NEGATIVE mg/dL   Protein, ur 100 (A) NEGATIVE mg/dL   Nitrite POSITIVE (A) NEGATIVE   Leukocytes, UA TRACE (A) NEGATIVE   RBC / HPF 0-5 0 - 5 RBC/hpf   WBC, UA 6-30 0 - 5 WBC/hpf   Bacteria, UA MANY (A) NONE SEEN   Squamous Epithelial / LPF 0-5 (A) NONE SEEN   Mucous PRESENT    Amorphous Crystal PRESENT   Lipase, blood  Result Value Ref Range   Lipase  21 11 - 51 U/L  I-stat troponin, ED  Result Value Ref Range   Troponin i, poc 0.01 0.00 - 0.08 ng/mL   Comment 3           Dg Chest 2 View  Result Date: 09/22/2016 CLINICAL DATA:  Cough with shortness of breath and fever EXAM: CHEST  2 VIEW COMPARISON:  April 08, 2015 FINDINGS: There is airspace consolidation in the posterior segment right upper lobe consistent with pneumonia. There is underlying mild generalized interstitial prominence, likely due to chronic inflammatory type change. Heart size and pulmonary vascularity are normal. No adenopathy. There is atherosclerotic calcification in the aorta. No evident bone lesions. IMPRESSION: Posterior segment right upper lobe pneumonia. Stable underlying interstitial prominence in a somewhat generalized manner, likely reflecting chronic inflammatory type change. Followup PA and lateral chest radiographs recommended in 3-4 weeks following trial of antibiotic therapy to ensure resolution and exclude underlying malignancy. Electronically Signed  By: Lowella Grip III M.D.   On: 09/22/2016 12:07    Patient with symptoms consistent with flu that started about a week ago. Patient with increased cough and congestion. Chest x-ray consistent with pneumonia. Patient short of breath oxygen sats are okay. Patient also with increased breathing rate. Recommend admission and treatment for the pneumonia.  Lungs are equal breath sounds bilaterally but marked rhonchi no wheezing. Heart regular rate abdomen soft and nontender.  Urinalysis raises some question for urinary tract infection. Probably needs urine culture sent. Definitely positive nitrites suggestive of urinary tract infection.     Fredia Sorrow, MD 09/22/16 1446

## 2016-09-22 NOTE — ED Notes (Signed)
Attempted report 

## 2016-09-22 NOTE — ED Triage Notes (Signed)
Pt presents with c/o flu like symptoms. The symptoms began last week and have been progressively worse since onset. she reports malaise, fevers, body aches sneezing, dry cough, sob, chest pain, nausea, vomiting, diarrhea. She has been forcing fluids but has had no appetite. She has tried theraflu and otc chest congestion medication with no relief

## 2016-09-22 NOTE — ED Notes (Signed)
Pt dry heaving  

## 2016-09-23 DIAGNOSIS — N179 Acute kidney failure, unspecified: Secondary | ICD-10-CM | POA: Diagnosis not present

## 2016-09-23 DIAGNOSIS — J1100 Influenza due to unidentified influenza virus with unspecified type of pneumonia: Secondary | ICD-10-CM | POA: Diagnosis not present

## 2016-09-23 DIAGNOSIS — J189 Pneumonia, unspecified organism: Secondary | ICD-10-CM

## 2016-09-23 LAB — BASIC METABOLIC PANEL
Anion gap: 12 (ref 5–15)
BUN: 12 mg/dL (ref 6–20)
CO2: 21 mmol/L — AB (ref 22–32)
Calcium: 8 mg/dL — ABNORMAL LOW (ref 8.9–10.3)
Chloride: 104 mmol/L (ref 101–111)
Creatinine, Ser: 1.12 mg/dL — ABNORMAL HIGH (ref 0.44–1.00)
GFR calc Af Amer: 59 mL/min — ABNORMAL LOW (ref >60)
GFR, EST NON AFRICAN AMERICAN: 51 mL/min — AB (ref >60)
GLUCOSE: 102 mg/dL — AB (ref 65–99)
POTASSIUM: 4 mmol/L (ref 3.5–5.1)
Sodium: 137 mmol/L (ref 135–145)

## 2016-09-23 LAB — CBC
HEMATOCRIT: 36.1 % (ref 36.0–46.0)
Hemoglobin: 12.4 g/dL (ref 12.0–15.0)
MCH: 29.5 pg (ref 26.0–34.0)
MCHC: 34.3 g/dL (ref 30.0–36.0)
MCV: 85.7 fL (ref 78.0–100.0)
Platelets: 211 10*3/uL (ref 150–400)
RBC: 4.21 MIL/uL (ref 3.87–5.11)
RDW: 14 % (ref 11.5–15.5)
WBC: 7.1 10*3/uL (ref 4.0–10.5)

## 2016-09-23 LAB — HIV ANTIBODY (ROUTINE TESTING W REFLEX): HIV SCREEN 4TH GENERATION: NONREACTIVE

## 2016-09-23 MED ORDER — OSELTAMIVIR PHOSPHATE 30 MG PO CAPS
30.0000 mg | ORAL_CAPSULE | Freq: Two times a day (BID) | ORAL | Status: DC
  Administered 2016-09-23 – 2016-09-24 (×3): 30 mg via ORAL
  Filled 2016-09-23 (×3): qty 1

## 2016-09-23 MED ORDER — AZITHROMYCIN 500 MG PO TABS
500.0000 mg | ORAL_TABLET | Freq: Every day | ORAL | Status: DC
  Administered 2016-09-23 – 2016-09-24 (×2): 500 mg via ORAL
  Filled 2016-09-23 (×2): qty 1

## 2016-09-23 MED ORDER — IPRATROPIUM-ALBUTEROL 0.5-2.5 (3) MG/3ML IN SOLN
3.0000 mL | Freq: Two times a day (BID) | RESPIRATORY_TRACT | Status: DC
Start: 2016-09-23 — End: 2016-09-24
  Administered 2016-09-23 – 2016-09-24 (×2): 3 mL via RESPIRATORY_TRACT
  Filled 2016-09-23 (×2): qty 3

## 2016-09-23 MED ORDER — MENTHOL 3 MG MT LOZG
1.0000 | LOZENGE | OROMUCOSAL | Status: DC | PRN
  Administered 2016-09-23: 3 mg via ORAL
  Filled 2016-09-23: qty 9

## 2016-09-23 NOTE — Progress Notes (Signed)
PROGRESS NOTE    Carol Dominguez  [id number] DOB: 03-22-1951 DOA: 09/22/2016 PCP: [name], MD   Brief Narrative: 66 y.o. female with past medical history significant for asthma, colon cancer in remission for 1 year, tobacco abuse and history of tuberculosis in the 71s who presented with shortness of breath. Found to have influenza positive and pneumonia.  Assessment & Plan:  # Influenza B positive, right upper lobe pneumonia: -Continue ceftriaxone, azithromycin. I will add Tamiflu. -Follow up culture results -Continue supportive care, droplet precaution. -On Mucinex, continue nebulization. -Patient is on room air.  #Acute kidney injury likely hemodynamically mediated, dehydration. Serum creatinine level improved. -UA with UTI, patient is asymptomatic. Patient is already on antibiotics for other indication. We will not treat asymptomatic bacteriuria. -Monitor labs.  Principal Problem:   AKI (acute kidney injury) ([place]) Active Problems:   CAP (community acquired pneumonia)   Dehydration  DVT prophylaxis: Lovenox subcutaneous Code Status: Full code Family Communication: No family present at bedside Disposition Plan: Likely discharge home tomorrow    Consultants:   None  Procedures: None Antimicrobials: Ceftriaxone, azithromycin and Tamiflu.  Subjective: Patient was seen and examined at bedside. Complaining of sore throat. Denied chest pain, shortness of breath, nausea or vomiting. No tenderness.  Objective: Vitals:   09/22/16 2143 09/23/16 0200 09/23/16 0649 09/23/16 0929  BP: 132/65  126/75 124/63  Pulse: 93  91 83  Resp: 17  16 16   Temp: (!) 100.7 F (38.2 C) 99.4 F (37.4 C) 99.1 F (37.3 C) 98.9 F (37.2 C)  TempSrc: Oral Oral Axillary Oral  SpO2: 94%  96% 97%  Weight: 56.8 kg (125 lb 3.5 oz)     Height:        Intake/Output Summary (Last 24 hours) at 09/23/16 1413 Last data filed at 09/23/16 0929  Gross per 24 hour  Intake           3263.33 ml  Output              950 ml  Net          2313.33 ml   Filed Weights   09/22/16 1700 09/22/16 2143  Weight: 56.8 kg (125 lb 3.5 oz) 56.8 kg (125 lb 3.5 oz)    Examination:  General exam: Appears calm and comfortable  Respiratory system: Clear to auscultation. Respiratory effort normal. No wheezing or crackle Cardiovascular system: S1 & S2 heard, RRR.  No pedal edema. Gastrointestinal system: Abdomen is nondistended, soft and nontender. Normal bowel sounds heard. Central nervous system: Alert and oriented. No focal neurological deficits. Extremities: Symmetric 5 x 5 power. Skin: No rashes, lesions or ulcers Psychiatry: Judgement and insight appear normal. Mood & affect appropriate.     Data Reviewed: I have personally reviewed following labs and imaging studies  CBC:  Recent Labs Lab 09/22/16 1134 09/22/16 1538 09/23/16 0649  WBC 7.9 7.5 7.1  HGB 14.3 13.5 12.4  HCT 40.9 38.9 36.1  MCV 85.7 86.1 85.7  PLT 202 176 [id number]   Basic Metabolic Panel:  Recent Labs Lab 09/22/16 1134 09/22/16 1538 09/23/16 0649  NA 133*  --  137  K 4.5  --  4.0  CL 97*  --  104  CO2 23  --  21*  GLUCOSE 118*  --  102*  BUN 17  --  12  CREATININE 1.43* 1.25* 1.12*  CALCIUM 8.8*  --  8.0*  MG  --  2.1  --   PHOS  --  2.6  --  GFR: Estimated Creatinine Clearance: 46.1 mL/min (by C-G formula based on SCr of 1.12 mg/dL (H)). Liver Function Tests:  Recent Labs Lab 09/22/16 1134  AST 120*  ALT 77*  ALKPHOS 79  BILITOT 1.7*  PROT 7.2  ALBUMIN 3.2*    Recent Labs Lab 09/22/16 1134  LIPASE 21   No results for input(s): AMMONIA in the last 168 hours. Coagulation Profile: No results for input(s): INR, PROTIME in the last 168 hours. Cardiac Enzymes: No results for input(s): CKTOTAL, CKMB, CKMBINDEX, TROPONINI in the last 168 hours. BNP (last 3 results) No results for input(s): PROBNP in the last 8760 hours. HbA1C: No results for input(s): HGBA1C in the last 72  hours. CBG:  Recent Labs Lab 09/22/16 1619  GLUCAP 238*   Lipid Profile: No results for input(s): CHOL, HDL, LDLCALC, TRIG, CHOLHDL, LDLDIRECT in the last 72 hours. Thyroid Function Tests: No results for input(s): TSH, T4TOTAL, FREET4, T3FREE, THYROIDAB in the last 72 hours. Anemia Panel: No results for input(s): VITAMINB12, FOLATE, FERRITIN, TIBC, IRON, RETICCTPCT in the last 72 hours. Sepsis Labs: No results for input(s): PROCALCITON, LATICACIDVEN in the last 168 hours.  Recent Results (from the past 240 hour(s))  Urine culture     Status: Abnormal (Preliminary result)   Collection Time: 09/22/16  2:30 PM  Result Value Ref Range Status   Specimen Description URINE, CLEAN CATCH  Final   Special Requests NONE  Final   Culture (A)  Final    >=100,000 COLONIES/mL ESCHERICHIA COLI SUSCEPTIBILITIES TO FOLLOW    Report Status PENDING  Incomplete  MRSA PCR Screening     Status: None   Collection Time: 09/22/16  5:03 PM  Result Value Ref Range Status   MRSA by PCR NEGATIVE NEGATIVE Final    Comment:        The GeneXpert MRSA Assay (FDA approved for NASAL specimens only), is one component of a comprehensive MRSA colonization surveillance program. It is not intended to diagnose MRSA infection nor to guide or monitor treatment for MRSA infections.          Radiology Studies: Dg Chest 2 View  Result Date: 09/22/2016 CLINICAL DATA:  Cough with shortness of breath and fever EXAM: CHEST  2 VIEW COMPARISON:  April 08, 2015 FINDINGS: There is airspace consolidation in the posterior segment right upper lobe consistent with pneumonia. There is underlying mild generalized interstitial prominence, likely due to chronic inflammatory type change. Heart size and pulmonary vascularity are normal. No adenopathy. There is atherosclerotic calcification in the aorta. No evident bone lesions. IMPRESSION: Posterior segment right upper lobe pneumonia. Stable underlying interstitial prominence  in a somewhat generalized manner, likely reflecting chronic inflammatory type change. Followup PA and lateral chest radiographs recommended in 3-4 weeks following trial of antibiotic therapy to ensure resolution and exclude underlying malignancy. Electronically Signed   By: Lowella Grip III M.D.   On: 09/22/2016 12:07        Scheduled Meds: . azithromycin  500 mg Oral Daily  . cefTRIAXone (ROCEPHIN)  IV  1 g Intravenous Once  . enoxaparin (LOVENOX) injection  40 mg Subcutaneous Q24H  . guaiFENesin  600 mg Oral BID  . ipratropium-albuterol  3 mL Nebulization BID  . oseltamivir  30 mg Oral BID   Continuous Infusions:   LOS: 0 days    Kianna Billet Tanna Furry, MD [facility] Pager [phone]  If 7PM-7AM, please contact night-coverage [email or web address] Password [email or web address] 09/23/2016, 2:13 PM

## 2016-09-24 DIAGNOSIS — N179 Acute kidney failure, unspecified: Secondary | ICD-10-CM | POA: Diagnosis not present

## 2016-09-24 DIAGNOSIS — J189 Pneumonia, unspecified organism: Secondary | ICD-10-CM | POA: Diagnosis not present

## 2016-09-24 DIAGNOSIS — J1100 Influenza due to unidentified influenza virus with unspecified type of pneumonia: Secondary | ICD-10-CM | POA: Diagnosis not present

## 2016-09-24 MED ORDER — ALBUTEROL SULFATE HFA 108 (90 BASE) MCG/ACT IN AERS: 2.0000 | INHALATION_SPRAY | Freq: Four times a day (QID) | RESPIRATORY_TRACT | 1 refills | Status: DC | PRN

## 2016-09-24 MED ORDER — LEVOFLOXACIN 500 MG PO TABS: 500.0000 mg | ORAL_TABLET | Freq: Every day | ORAL | 0 refills | Status: AC

## 2016-09-24 MED ORDER — OSELTAMIVIR PHOSPHATE 30 MG PO CAPS: 30.0000 mg | ORAL_CAPSULE | Freq: Two times a day (BID) | ORAL | 0 refills | Status: AC

## 2016-09-24 NOTE — Discharge Summary (Addendum)
Physician Discharge Summary  Carol Dominguez [id number] DOB: 1951-04-06 DOA: 09/22/2016  PCP: [name], MD  Admit date: 09/22/2016 Discharge date: 09/24/2016  Admitted From:Home Disposition: Home    Recommendations for Outpatient Follow-up:  1. Follow up with PCP in 1-2 weeks 2. Please obtain BMP/CBC in one week   Home Health: No Equipment/Devices: No Discharge Condition: Stable CODE STATUS: Full code Diet recommendation: Heart healthy  Brief/Interim Summary:66 y.o.femalewith past medical history significant for asthma, colon cancer in remission for 1 year, tobacco abuse and history of tuberculosis in the 32s who presented with shortness of breath. Found to have influenza positive and pneumonia  # Influenza B positive, right upper lobe pneumonia: -Treated with ceftriaxone, azithromycin and Tamiflu. -Cultures no growth so far. Patient clinically improved. She is not hypoxic. Denied chest pain, shortness of breath or cough. Plan to discharge with oral Levaquin and Tamiflu to complete the course. -I ordered albuterol inhaler as needed.  #Acute kidney injury likely hemodynamically mediated, dehydration. Serum creatinine level improved. Recommended to monitor lab with PCP. -UA with UTI, patient is asymptomatic. Patient is already on antibiotics for other indication. We will not treat asymptomatic bacteriuria.  Patient clinically improved. Verbalized understanding of follow-up instruction. I recommended patient to follow-up with her PCP in a week. Patient is medically stable.  Patient with few elevated blood pressure reading but has no diagnosis of hypertension. Recommended to monitor blood pressure at home and follow up with PCP.  Discharge Diagnoses:  Principal Problem:   AKI (acute kidney injury) ([place]) Active Problems:   Community acquired pneumonia   Dehydration   Influenza with pneumonia    Discharge Instructions  Discharge Instructions    Call MD for:   difficulty breathing, headache or visual disturbances    Complete by:  As directed    Call MD for:  extreme fatigue    Complete by:  As directed    Call MD for:  hives    Complete by:  As directed    Call MD for:  persistant dizziness or light-headedness    Complete by:  As directed    Call MD for:  persistant nausea and vomiting    Complete by:  As directed    Call MD for:  severe uncontrolled pain    Complete by:  As directed    Call MD for:  temperature >100.4    Complete by:  As directed    Diet - low sodium heart healthy    Complete by:  As directed    Increase activity slowly    Complete by:  As directed      Allergies as of 09/24/2016      Reactions   Codeine    "Makes me sick."      Medication List    TAKE these medications   albuterol 108 (90 Base) MCG/ACT inhaler Commonly known as:  PROVENTIL HFA;VENTOLIN HFA Inhale 2 puffs into the lungs every 6 (six) hours as needed for wheezing or shortness of breath.   guaifenesin 100 MG/5ML syrup Commonly known as:  ROBITUSSIN Take 200 mg by mouth 3 (three) times daily as needed for cough.   levofloxacin 500 MG tablet Commonly known as:  LEVAQUIN Take 1 tablet (500 mg total) by mouth daily.   oseltamivir 30 MG capsule Commonly known as:  TAMIFLU Take 1 capsule (30 mg total) by mouth 2 (two) times daily.   THERAFLU COLD & COUGH 05-27-19 MG Pack Generic drug:  Phenylephrine-Pheniramine-DM Take 1 packet by mouth  daily as needed (flu symptoms).      Follow-up Information    [name], MD. Schedule an appointment as soon as possible for a visit in 1 week(s).   Specialty:  Family Medicine Contact information: [address] [place] [address] [phone]          Allergies  Allergen Reactions  . Codeine     "Makes me sick."    Consultations: None  Procedures/Studies: None  Subjective: Patient was seen and examined at bedside. She reported feeling much better and ready to go home today.  Denied fever, chills, headache, dizziness, nausea, vomiting, chest pain, shortness of breath.  Discharge Exam: Vitals:   09/24/16 0637 09/24/16 0924  BP: (!) 148/83 (!) 151/101  Pulse: 74 78  Resp: 18 16  Temp: 98.2 F (36.8 C) 98.1 F (36.7 C)   Vitals:   09/24/16 0210 09/24/16 0637 09/24/16 0924 09/24/16 1003  BP:  (!) 148/83 (!) 151/101   Pulse:  74 78   Resp:  18 16   Temp:  98.2 F (36.8 C) 98.1 F (36.7 C)   TempSrc:  Oral Oral   SpO2:  95% 98% 98%  Weight: 56.8 kg (125 lb 3.5 oz)     Height:        General: Pt is alert, awake, not in acute distress Cardiovascular: RRR, S1/S2 +, no rubs, no gallops Respiratory: CTA bilaterally, no wheezing, no rhonchi Abdominal: Soft, NT, ND, bowel sounds + Extremities: no edema, no cyanosis Neurology: Alert, awake, following commands. Nonfocal neurological exam.   The results of significant diagnostics from this hospitalization (including imaging, microbiology, ancillary and laboratory) are listed below for reference.     Microbiology: Recent Results (from the past 240 hour(s))  Urine culture     Status: Abnormal (Preliminary result)   Collection Time: 09/22/16  2:30 PM  Result Value Ref Range Status   Specimen Description URINE, CLEAN CATCH  Final   Special Requests NONE  Final   Culture (A)  Final    >=100,000 COLONIES/mL ESCHERICHIA COLI SUSCEPTIBILITIES TO FOLLOW    Report Status PENDING  Incomplete  MRSA PCR Screening     Status: None   Collection Time: 09/22/16  5:03 PM  Result Value Ref Range Status   MRSA by PCR NEGATIVE NEGATIVE Final    Comment:        The GeneXpert MRSA Assay (FDA approved for NASAL specimens only), is one component of a comprehensive MRSA colonization surveillance program. It is not intended to diagnose MRSA infection nor to guide or monitor treatment for MRSA infections.      Labs: BNP (last 3 results) No results for input(s): BNP in the last 8760 hours. Basic Metabolic  Panel:  Recent Labs Lab 09/22/16 1134 09/22/16 1538 09/23/16 0649  NA 133*  --  137  K 4.5  --  4.0  CL 97*  --  104  CO2 23  --  21*  GLUCOSE 118*  --  102*  BUN 17  --  12  CREATININE 1.43* 1.25* 1.12*  CALCIUM 8.8*  --  8.0*  MG  --  2.1  --   PHOS  --  2.6  --    Liver Function Tests:  Recent Labs Lab 09/22/16 1134  AST 120*  ALT 77*  ALKPHOS 79  BILITOT 1.7*  PROT 7.2  ALBUMIN 3.2*    Recent Labs Lab 09/22/16 1134  LIPASE 21   No results for input(s): AMMONIA in  the last 168 hours. CBC:  Recent Labs Lab 09/22/16 1134 09/22/16 1538 09/23/16 0649  WBC 7.9 7.5 7.1  HGB 14.3 13.5 12.4  HCT 40.9 38.9 36.1  MCV 85.7 86.1 85.7  PLT 202 176 211   Cardiac Enzymes: No results for input(s): CKTOTAL, CKMB, CKMBINDEX, TROPONINI in the last 168 hours. BNP: Invalid input(s): POCBNP CBG:  Recent Labs Lab 09/22/16 1619  GLUCAP 238*   D-Dimer No results for input(s): DDIMER in the last 72 hours. Hgb A1c No results for input(s): HGBA1C in the last 72 hours. Lipid Profile No results for input(s): CHOL, HDL, LDLCALC, TRIG, CHOLHDL, LDLDIRECT in the last 72 hours. Thyroid function studies No results for input(s): TSH, T4TOTAL, T3FREE, THYROIDAB in the last 72 hours.  Invalid input(s): FREET3 Anemia work up No results for input(s): VITAMINB12, FOLATE, FERRITIN, TIBC, IRON, RETICCTPCT in the last 72 hours. Urinalysis    Component Value Date/Time   COLORURINE AMBER (A) 09/22/2016 1224   APPEARANCEUR HAZY (A) 09/22/2016 1224   LABSPEC 1.020 09/22/2016 1224   PHURINE 5.0 09/22/2016 1224   GLUCOSEU NEGATIVE 09/22/2016 1224   HGBUR MODERATE (A) 09/22/2016 1224   BILIRUBINUR NEGATIVE 09/22/2016 1224   KETONESUR 5 (A) 09/22/2016 1224   PROTEINUR 100 (A) 09/22/2016 1224   NITRITE POSITIVE (A) 09/22/2016 1224   LEUKOCYTESUR TRACE (A) 09/22/2016 1224   Sepsis Labs Invalid input(s): PROCALCITONIN,  WBC,  LACTICIDVEN Microbiology Recent Results (from the  past 240 hour(s))  Urine culture     Status: Abnormal (Preliminary result)   Collection Time: 09/22/16  2:30 PM  Result Value Ref Range Status   Specimen Description URINE, CLEAN CATCH  Final   Special Requests NONE  Final   Culture (A)  Final    >=100,000 COLONIES/mL ESCHERICHIA COLI SUSCEPTIBILITIES TO FOLLOW    Report Status PENDING  Incomplete  MRSA PCR Screening     Status: None   Collection Time: 09/22/16  5:03 PM  Result Value Ref Range Status   MRSA by PCR NEGATIVE NEGATIVE Final    Comment:        The GeneXpert MRSA Assay (FDA approved for NASAL specimens only), is one component of a comprehensive MRSA colonization surveillance program. It is not intended to diagnose MRSA infection nor to guide or monitor treatment for MRSA infections.      Time coordinating discharge: 26 minutes  SIGNED:   Rosita Fire, MD  [facility] 09/24/2016, 10:11 AM  If 7PM-7AM, please contact night-coverage [email or web address] Password [email or web address]

## 2016-09-24 NOTE — Progress Notes (Signed)
Quintella Baton to be D/C'd Home per MD order.  Discussed prescriptions and follow up appointments with the patient. Prescriptions given to patient, medication list explained in detail. Pt verbalized understanding.  Allergies as of 09/24/2016      Reactions   Codeine    "Makes me sick."      Medication List    TAKE these medications   albuterol 108 (90 Base) MCG/ACT inhaler Commonly known as:  PROVENTIL HFA;VENTOLIN HFA Inhale 2 puffs into the lungs every 6 (six) hours as needed for wheezing or shortness of breath.   guaifenesin 100 MG/5ML syrup Commonly known as:  ROBITUSSIN Take 200 mg by mouth 3 (three) times daily as needed for cough.   levofloxacin 500 MG tablet Commonly known as:  LEVAQUIN Take 1 tablet (500 mg total) by mouth daily.   oseltamivir 30 MG capsule Commonly known as:  TAMIFLU Take 1 capsule (30 mg total) by mouth 2 (two) times daily.   THERAFLU COLD & COUGH 05-27-19 MG Pack Generic drug:  Phenylephrine-Pheniramine-DM Take 1 packet by mouth daily as needed (flu symptoms).       Vitals:   09/24/16 0637 09/24/16 0924  BP: (!) 148/83 (!) 151/101  Pulse: 74 78  Resp: 18 16  Temp: 98.2 F (36.8 C) 98.1 F (36.7 C)    Skin clean, dry and intact without evidence of skin break down, no evidence of skin tears noted. IV catheter discontinued intact. Site without signs and symptoms of complications. Dressing and pressure applied. Pt denies pain at this time. No complaints noted.  An After Visit Summary was printed and given to the patient. Patient escorted via [place], and D/C home via private auto.  Retta Mac BSN, RN

## 2016-09-25 LAB — URINE CULTURE: Culture: >=100000 — AB

## 2017-11-15 ENCOUNTER — Encounter: Payer: Self-pay | Admitting: Internal Medicine

## 2018-01-09 ENCOUNTER — Ambulatory Visit (AMBULATORY_SURGERY_CENTER): Payer: Self-pay | Admitting: *Deleted

## 2018-01-09 ENCOUNTER — Other Ambulatory Visit: Payer: Self-pay

## 2018-01-09 VITALS — Ht 69.0 in | Wt 128.0 lb

## 2018-01-09 DIAGNOSIS — Z85038 Personal history of other malignant neoplasm of large intestine: Secondary | ICD-10-CM

## 2018-01-09 DIAGNOSIS — Z86010 Personal history of colonic polyps: Secondary | ICD-10-CM

## 2018-01-09 MED ORDER — NA SULFATE-K SULFATE-MG SULF 17.5-3.13-1.6 GM/177ML PO SOLN: ORAL | 0 refills | Status: DC

## 2018-01-09 NOTE — Progress Notes (Signed)
Patient denies any allergies to eggs or soy. Patient denies any problems with anesthesia/sedation. Patient denies any oxygen use at home. Patient denies taking any diet/weight loss medications or blood thinners. EMMI education offered, pt declined, no computer/email. Sample given of Suprep to pt.

## 2018-01-10 ENCOUNTER — Encounter: Payer: Self-pay | Admitting: Internal Medicine

## 2018-01-23 ENCOUNTER — Telehealth: Payer: Self-pay | Admitting: Internal Medicine

## 2018-01-23 ENCOUNTER — Encounter: Payer: Medicare HMO | Admitting: Internal Medicine

## 2018-01-30 ENCOUNTER — Encounter: Payer: Medicare HMO | Admitting: Internal Medicine

## 2018-04-01 ENCOUNTER — Encounter: Payer: Medicare HMO | Admitting: Internal Medicine

## 2018-05-21 ENCOUNTER — Other Ambulatory Visit: Payer: Self-pay

## 2018-05-21 ENCOUNTER — Emergency Department (HOSPITAL_COMMUNITY)
Admission: EM | Admit: 2018-05-21 | Discharge: 2018-05-22 | Disposition: A | Discharge status: Home / Self Care | Payer: Medicare HMO | Attending: Emergency Medicine | Admitting: Emergency Medicine

## 2018-05-21 DIAGNOSIS — J45909 Unspecified asthma, uncomplicated: Secondary | ICD-10-CM | POA: Diagnosis not present

## 2018-05-21 DIAGNOSIS — F17210 Nicotine dependence, cigarettes, uncomplicated: Secondary | ICD-10-CM | POA: Insufficient documentation

## 2018-05-21 DIAGNOSIS — K0889 Other specified disorders of teeth and supporting structures: Secondary | ICD-10-CM | POA: Insufficient documentation

## 2018-05-21 NOTE — ED Triage Notes (Signed)
Patient c/o tooth problems, "they are coming out, breaking off, causing pain".

## 2018-05-22 MED ORDER — PENICILLIN V POTASSIUM 500 MG PO TABS: 500.0000 mg | ORAL_TABLET | Freq: Four times a day (QID) | ORAL | 0 refills | Status: DC

## 2018-05-22 MED ORDER — TRAMADOL HCL 50 MG PO TABS
50.0000 mg | ORAL_TABLET | Freq: Once | ORAL | Status: AC
  Administered 2018-05-22: 50 mg via ORAL
  Filled 2018-05-22: qty 1

## 2018-05-22 MED ORDER — PENICILLIN V POTASSIUM 250 MG PO TABS
500.0000 mg | ORAL_TABLET | Freq: Once | ORAL | Status: AC
  Administered 2018-05-22: 500 mg via ORAL
  Filled 2018-05-22: qty 2

## 2018-05-22 MED ORDER — TRAMADOL HCL 50 MG PO TABS: 50.0000 mg | ORAL_TABLET | Freq: Four times a day (QID) | ORAL | 0 refills | Status: DC | PRN

## 2018-05-22 NOTE — Discharge Instructions (Signed)
Take the prescribed medication as directed. Follow-up with dentist-- no dentist on call today but you can see resource guide to help establish care. Return to the ED for new or worsening symptoms.

## 2018-05-22 NOTE — ED Provider Notes (Signed)
[facility] EMERGENCY DEPARTMENT Provider Note   CSN: [id number] Arrival date & time: 05/21/18  2326     History   Chief Complaint Chief Complaint  Patient presents with  . Dental Pain    HPI Carol Dominguez is a 67 y.o. female.  The history is provided by the patient and medical records.  Dental Pain       67 year old female with history of anemia, asthma, colon cancer, depression, iron deficiency, ovarian cyst, presenting to the ED with dental issues.  Reports for the past 2 weeks she has had multiple teeth break and fall out.  States this is gotten worse over the past few days.  States now it is hard to eat because her teeth are very loose and it is painful.  States she feels like she may be get an abscess along her right lower gums.  She has not had any difficulty swallowing.  No fever or chills.  No facial or neck swelling.  She is not taking any medications at home for her symptoms.  Past Medical History:  Diagnosis Date  . Anemia   . Asthma   . Cancer ([place]) 04/2015   colon cancer   . Depression   . Foot fracture, right   . Iron deficiency   . Ovarian cyst 1975   [facility]  . Teeth decayed   . Tobacco use   . Tuberculosis 1973    Patient Active Problem List   Diagnosis Date Noted  . Influenza with pneumonia   . AKI (acute kidney injury) ([place]) 09/22/2016  . Community acquired pneumonia 09/22/2016  . Dehydration 09/22/2016  . Protein-calorie malnutrition, moderate ([place]) 04/14/2015  . Claustrophobia 04/09/2015  . Internal complete rectal prolapse with intussusception of rectosigmoid 04/09/2015  . Tobacco use   . Rectal prolapse 04/08/2015  . Intussusception of colon mass into rectum with obstruction s/p lap LAR / rectopexy 04/09/2015 04/08/2015    Past Surgical History:  Procedure Laterality Date  . COLON SURGERY  2016  . COLONOSCOPY    . CYST EXCISION Bilateral 1973   Right ac and left wrist   . DILATION AND CURETTAGE OF UTERUS      . INGUINAL HERNIA REPAIR Right    43-22 y/o - Dr. Ronnald Ramp  . LAPAROSCOPIC LOW ANTERIOR RESECTION N/A 04/09/2015   Procedure: LAPAROSCOPIC LOW ANTERIOR RESECTION;  Surgeon: Michael Boston, MD;  Location: [facility];  Service: General;  Laterality: N/A;  . OVARIAN CYST REMOVAL Right 1975   Dr. Ronnald Ramp  . RECTOPEXY N/A 04/09/2015   Procedure: RECTOPEXY;  Surgeon: Michael Boston, MD;  Location: [facility];  Service: General;  Laterality: N/A;     OB History   None      Home Medications    Prior to Admission medications   Medication Sig Start Date End Date Taking? Authorizing Provider  Na Sulfate-K Sulfate-Mg Sulf 17.5-3.13-1.6 GM/177ML SOLN Suprep sample-1 kit given. Take as directed. Lot# [id number],JYN. 4/21. 01/09/18   Pyrtle, Lajuan Lines, MD    Family History Family History  Problem Relation Age of Onset  . Pancreatic cancer Mother   . Colon cancer Neg Hx   . Esophageal cancer Neg Hx   . Stomach cancer Neg Hx     Social History Social History   Tobacco Use  . Smoking status: Current Every Day Smoker    Packs/day: 0.25    Types: Cigarettes  . Smokeless tobacco: Never Used  Substance Use Topics  . Alcohol use: No  Alcohol/week: 0.0 standard drinks  . Drug use: No     Allergies   Codeine   Review of Systems Review of Systems  HENT: Positive for dental problem.   All other systems reviewed and are negative.    Physical Exam Updated Vital Signs BP 139/81 (BP Location: Right Arm)   Pulse 78   Temp 98 F (36.7 C) (Oral)   Resp 16   SpO2 98%   Physical Exam  Constitutional: She is oriented to person, place, and time. She appears well-developed and well-nourished.  HENT:  Head: Normocephalic and atraumatic.  Mouth/Throat: Oropharynx is clear and moist.  Teeth largely in very poor dentition, multiple teeth are broken and decayed, gums overall ok but does have small area of early abscess formation along right lower gums, handling secretions appropriately, no trismus, no facial or  neck swelling, normal phonation without stridor  Eyes: Pupils are equal, round, and reactive to light. Conjunctivae and EOM are normal.  Neck: Normal range of motion.  Cardiovascular: Normal rate, regular rhythm and normal heart sounds.  Pulmonary/Chest: Effort normal and breath sounds normal. No stridor. No respiratory distress.  Abdominal: Soft. Bowel sounds are normal. There is no tenderness. There is no rebound.  Musculoskeletal: Normal range of motion.  Neurological: She is alert and oriented to person, place, and time.  Skin: Skin is warm and dry.  Psychiatric: She has a normal mood and affect.  Nursing note and vitals reviewed.    ED Treatments / Results  Labs (all labs ordered are listed, but only abnormal results are displayed) Labs Reviewed - No data to display  EKG None  Radiology No results found.  Procedures Procedures (including critical care time)  Medications Ordered in ED Medications  penicillin v potassium (VEETID) tablet 500 mg (500 mg Oral Given 05/22/18 0133)  traMADol (ULTRAM) tablet 50 mg (50 mg Oral Given 05/22/18 0133)     Initial Impression / Assessment and Plan / ED Course  I have reviewed the triage vital signs and the nursing notes.  Pertinent labs & imaging results that were available during my care of the patient were reviewed by me and considered in my medical decision making (see chart for details).  67 y.o. F here with dental pain.  Has had a low of decay and tooth breakage over the past few months.  She is afebrile, non-toxic.  Does have diffuse dental decay as well as evidence of early abscess formation along left lower molars.  No facial or neck swelling.  Not clinically concerning for ludwig's angina.  Will start on abx and refer to dentist for follow-up-- no dentist on call today so given resource guide.  Discussed plan with patient, she acknowledged understanding and agreed with plan of care.  Return precautions given for new or  worsening symptoms.  Final Clinical Impressions(s) / ED Diagnoses   Final diagnoses:  Pain, dental    ED Discharge Orders         Ordered    penicillin v potassium (VEETID) 500 MG tablet  4 times daily     05/22/18 0204    traMADol (ULTRAM) 50 MG tablet  Every 6 hours PRN     05/22/18 0204           Larene Pickett, PA-C 05/22/18 8675    Merryl Hacker, MD 05/22/18 [phone]

## 2018-05-22 NOTE — ED Notes (Signed)
PT states understanding of care given, follow up care, and medication prescribed. PT ambulated from ED to car with a steady gait. 

## 2018-05-22 NOTE — ED Notes (Signed)
Patient is A&Ox4.  No signs of distress noted.  Please see providers complete history and physical exam.  

## 2018-06-04 LAB — GLUCOSE, POCT (MANUAL RESULT ENTRY): POC GLUCOSE: 86 mg/dL (ref 70–99)

## 2018-06-27 ENCOUNTER — Other Ambulatory Visit: Payer: Self-pay | Admitting: Internal Medicine

## 2018-06-27 DIAGNOSIS — Z1231 Encounter for screening mammogram for malignant neoplasm of breast: Secondary | ICD-10-CM

## 2018-07-25 ENCOUNTER — Ambulatory Visit: Payer: Medicare HMO

## 2018-08-14 ENCOUNTER — Encounter: Payer: Self-pay | Admitting: Internal Medicine

## 2018-08-29 ENCOUNTER — Ambulatory Visit (AMBULATORY_SURGERY_CENTER): Payer: Self-pay

## 2018-08-29 ENCOUNTER — Encounter: Payer: Self-pay | Admitting: Internal Medicine

## 2018-08-29 VITALS — Ht 69.0 in | Wt 130.8 lb

## 2018-08-29 DIAGNOSIS — D369 Benign neoplasm, unspecified site: Secondary | ICD-10-CM

## 2018-08-29 DIAGNOSIS — Z85038 Personal history of other malignant neoplasm of large intestine: Secondary | ICD-10-CM

## 2018-08-29 MED ORDER — NA SULFATE-K SULFATE-MG SULF 17.5-3.13-1.6 GM/177ML PO SOLN: 1.0000 | Freq: Once | ORAL | 0 refills | Status: AC

## 2018-08-29 NOTE — Progress Notes (Signed)
Per pt, no allergies to soy or egg products.Pt not taking any weight loss meds or using  O2 at home.  Pt refused emmi video. 

## 2018-09-07 HISTORY — PX: COLONOSCOPY: SHX174

## 2018-09-12 ENCOUNTER — Encounter: Payer: Self-pay | Admitting: Internal Medicine

## 2018-09-12 ENCOUNTER — Ambulatory Visit (AMBULATORY_SURGERY_CENTER): Payer: Medicare HMO | Admitting: Internal Medicine

## 2018-09-12 VITALS — BP 112/67 | HR 56 | Temp 98.2°F | Resp 20 | Ht 69.0 in | Wt 130.0 lb

## 2018-09-12 DIAGNOSIS — K635 Polyp of colon: Secondary | ICD-10-CM | POA: Diagnosis not present

## 2018-09-12 DIAGNOSIS — Z85038 Personal history of other malignant neoplasm of large intestine: Secondary | ICD-10-CM

## 2018-09-12 DIAGNOSIS — D123 Benign neoplasm of transverse colon: Secondary | ICD-10-CM | POA: Diagnosis not present

## 2018-09-12 DIAGNOSIS — D124 Benign neoplasm of descending colon: Secondary | ICD-10-CM

## 2018-09-12 MED ORDER — SODIUM CHLORIDE 0.9 % IV SOLN: 500.0000 mL | Freq: Once | INTRAVENOUS | Status: DC

## 2018-09-12 NOTE — Op Note (Signed)
[facility] Patient Name: Carol Dominguez Procedure Date: 09/12/2018 1:27 PM MRN: [id number] Endoscopist: Jerene Bears , MD Age: 68 Referring MD:  Date of Birth: 1950/09/16 Gender: Female Account #: [id number] Procedure:                Colonoscopy Indications:              High risk colon cancer surveillance: Personal                            history of colon cancer s/p LAR 04/2015, first and                            last colonoscopy Jan 2017 Medicines:                Monitored Anesthesia Care Procedure:                Pre-Anesthesia Assessment:                           - Prior to the procedure, a History and Physical                            was performed, and patient medications and                            allergies were reviewed. The patient's tolerance of                            previous anesthesia was also reviewed. The risks                            and benefits of the procedure and the sedation                            options and risks were discussed with the patient.                            All questions were answered, and informed consent                            was obtained. Prior Anticoagulants: The patient has                            taken no previous anticoagulant or antiplatelet                            agents. ASA Grade Assessment: II - A patient with                            mild systemic disease. After reviewing the risks                            and benefits, the patient was deemed in  satisfactory condition to undergo the procedure.                           After obtaining informed consent, the colonoscope                            was passed under direct vision. Throughout the                            procedure, the patient's blood pressure, pulse, and                            oxygen saturations were monitored continuously. The                            Colonoscope was introduced through  the anus and                            advanced to the cecum, identified by appendiceal                            orifice and ileocecal valve. The colonoscopy was                            performed without difficulty. The patient tolerated                            the procedure well. The quality of the bowel                            preparation was good. The ileocecal valve,                            appendiceal orifice, and rectum were photographed. Scope In: 1:43:44 PM Scope Out: 2:01:49 PM Scope Withdrawal Time: 0 hours 12 minutes 21 seconds  Total Procedure Duration: 0 hours 18 minutes 5 seconds  Findings:                 The digital rectal exam was normal.                           A 5 mm polyp was found in the transverse colon. The                            polyp was sessile. The polyp was removed with a                            cold snare. Resection and retrieval were complete.                           Two sessile polyps were found in the splenic                            flexure. The polyps were 4  to 5 mm in size. These                            polyps were removed with a cold snare. Resection                            and retrieval were complete.                           A 4 mm polyp was found in the descending colon. The                            polyp was sessile. The polyp was removed with a                            cold snare. Resection and retrieval were complete.                           There was evidence of a prior end-to-side                            colo-colonic anastomosis in the recto-sigmoid                            colon. This was patent and was characterized by                            healthy appearing mucosa.                           Retroflexion in the rectum was not performed due to                            narrow rectal vault. Complications:            No immediate complications. Estimated Blood Loss:     Estimated blood loss was  minimal. Impression:               - One 5 mm polyp in the transverse colon, removed                            with a cold snare. Resected and retrieved.                           - Two 4 to 5 mm polyps at the splenic flexure,                            removed with a cold snare. Resected and retrieved.                           - One 4 mm polyp in the descending colon, removed                            with a cold snare.  Resected and retrieved.                           - Patent end-to-side colo-colonic anastomosis,                            characterized by healthy appearing mucosa. Recommendation:           - Patient has a contact number available for                            emergencies. The signs and symptoms of potential                            delayed complications were discussed with the                            patient. Return to normal activities tomorrow.                            Written discharge instructions were provided to the                            patient.                           - Resume previous diet.                           - Continue present medications.                           - Await pathology results.                           - Repeat colonoscopy is recommended for                            surveillance. The colonoscopy date will be                            determined after pathology results from today's                            exam become available for review. Jerene Bears, MD 09/12/2018 2:10:13 PM This report has been signed electronically.

## 2018-09-12 NOTE — Progress Notes (Signed)
PT taken to PACU. Monitors in place. VSS. Report given to RN. 

## 2018-09-12 NOTE — Patient Instructions (Signed)
Please read handouts provided. Continue present medications. Await pathology results.     YOU HAD AN ENDOSCOPIC PROCEDURE TODAY AT THE [facility]:   Refer to the procedure report that was given to you for any specific questions about what was found during the examination.  If the procedure report does not answer your questions, please call your gastroenterologist to clarify.  If you requested that your care partner not be given the details of your procedure findings, then the procedure report has been included in a sealed envelope for you to review at your convenience later.  YOU SHOULD EXPECT: Some feelings of bloating in the abdomen. Passage of more gas than usual.  Walking can help get rid of the air that was put into your GI tract during the procedure and reduce the bloating. If you had a lower endoscopy (such as a colonoscopy or flexible sigmoidoscopy) you may notice spotting of blood in your stool or on the toilet paper. If you underwent a bowel prep for your procedure, you may not have a normal bowel movement for a few days.  Please Note:  You might notice some irritation and congestion in your nose or some drainage.  This is from the oxygen used during your procedure.  There is no need for concern and it should clear up in a day or so.  SYMPTOMS TO REPORT IMMEDIATELY:   Following lower endoscopy (colonoscopy or flexible sigmoidoscopy):  Excessive amounts of blood in the stool  Significant tenderness or worsening of abdominal pains  Swelling of the abdomen that is new, acute  Fever of 100F or higher    For urgent or emergent issues, a gastroenterologist can be reached at any hour by calling [phone].   DIET:  We do recommend a small meal at first, but then you may proceed to your regular diet.  Drink plenty of fluids but you should avoid alcoholic beverages for 24 hours.  ACTIVITY:  You should plan to take it easy for the rest of today and you should NOT DRIVE  or use heavy machinery until tomorrow (because of the sedation medicines used during the test).    FOLLOW UP: Our staff will call the number listed on your records the next business day following your procedure to check on you and address any questions or concerns that you may have regarding the information given to you following your procedure. If we do not reach you, we will leave a message.  However, if you are feeling well and you are not experiencing any problems, there is no need to return our call.  We will assume that you have returned to your regular daily activities without incident.  If any biopsies were taken you will be contacted by phone or by letter within the next 1-3 weeks.  Please call us at [phone] if you have not heard about the biopsies in 3 weeks.    SIGNATURES/CONFIDENTIALITY: You and/or your care partner have signed paperwork which will be entered into your electronic medical record.  These signatures attest to the fact that that the information above on your After Visit Summary has been reviewed and is understood.  Full responsibility of the confidentiality of this discharge information lies with you and/or your care-partner. 

## 2018-09-12 NOTE — Progress Notes (Signed)
Called to room to assist during endoscopic procedure.  Patient ID and intended procedure confirmed with present staff. Received instructions for my participation in the procedure from the performing physician.  

## 2018-09-12 NOTE — Progress Notes (Signed)
Pt's states no medical or surgical changes since previsit or office visit. 

## 2018-09-13 ENCOUNTER — Telehealth: Payer: Self-pay | Admitting: *Deleted

## 2018-09-13 ENCOUNTER — Ambulatory Visit: Payer: Medicare HMO

## 2018-09-13 NOTE — Telephone Encounter (Signed)
Second follow up call attempt.  Reached voicemail with no identifiers.  No message left.

## 2018-09-13 NOTE — Telephone Encounter (Signed)
No answer at phone number given. Will follow up later today.

## 2018-09-17 ENCOUNTER — Encounter: Payer: Self-pay | Admitting: Internal Medicine

## 2018-10-16 ENCOUNTER — Other Ambulatory Visit: Payer: Self-pay

## 2018-10-16 ENCOUNTER — Ambulatory Visit
Admission: RE | Admit: 2018-10-16 | Discharge: 2018-10-16 | Disposition: A | Discharge status: Home / Self Care | Payer: Medicare HMO | Source: Ambulatory Visit | Attending: Internal Medicine | Admitting: Internal Medicine

## 2018-10-16 DIAGNOSIS — Z1231 Encounter for screening mammogram for malignant neoplasm of breast: Secondary | ICD-10-CM

## 2018-10-17 ENCOUNTER — Other Ambulatory Visit: Payer: Self-pay | Admitting: Internal Medicine

## 2018-10-17 DIAGNOSIS — R928 Other abnormal and inconclusive findings on diagnostic imaging of breast: Secondary | ICD-10-CM

## 2018-10-22 ENCOUNTER — Other Ambulatory Visit: Payer: Medicare HMO

## 2018-10-29 ENCOUNTER — Other Ambulatory Visit: Payer: Medicare HMO

## 2018-12-03 ENCOUNTER — Other Ambulatory Visit: Payer: Medicare HMO

## 2018-12-10 ENCOUNTER — Other Ambulatory Visit: Payer: Medicare HMO

## 2018-12-13 ENCOUNTER — Other Ambulatory Visit: Payer: Self-pay | Admitting: Internal Medicine

## 2018-12-13 ENCOUNTER — Ambulatory Visit
Admission: RE | Admit: 2018-12-13 | Discharge: 2018-12-13 | Disposition: A | Discharge status: Home / Self Care | Payer: Medicare HMO | Source: Ambulatory Visit | Attending: Internal Medicine | Admitting: Internal Medicine

## 2018-12-13 ENCOUNTER — Ambulatory Visit: Payer: Medicare HMO

## 2018-12-13 ENCOUNTER — Other Ambulatory Visit: Payer: Self-pay

## 2018-12-13 DIAGNOSIS — R928 Other abnormal and inconclusive findings on diagnostic imaging of breast: Secondary | ICD-10-CM

## 2019-01-06 ENCOUNTER — Ambulatory Visit
Admission: RE | Admit: 2019-01-06 | Discharge: 2019-01-06 | Disposition: A | Discharge status: Home / Self Care | Payer: Medicare HMO | Source: Ambulatory Visit | Attending: Internal Medicine | Admitting: Internal Medicine

## 2019-01-06 ENCOUNTER — Other Ambulatory Visit: Payer: Self-pay | Admitting: Internal Medicine

## 2019-01-06 ENCOUNTER — Other Ambulatory Visit: Payer: Self-pay

## 2019-01-06 DIAGNOSIS — R928 Other abnormal and inconclusive findings on diagnostic imaging of breast: Secondary | ICD-10-CM

## 2019-05-28 ENCOUNTER — Emergency Department (HOSPITAL_COMMUNITY)
Admission: EM | Admit: 2019-05-28 | Discharge: 2019-05-28 | Disposition: A | Discharge status: Home / Self Care | Payer: Medicare HMO | Attending: Emergency Medicine | Admitting: Emergency Medicine

## 2019-05-28 ENCOUNTER — Encounter (HOSPITAL_COMMUNITY): Payer: Self-pay

## 2019-05-28 ENCOUNTER — Other Ambulatory Visit: Payer: Self-pay

## 2019-05-28 DIAGNOSIS — J45909 Unspecified asthma, uncomplicated: Secondary | ICD-10-CM | POA: Diagnosis not present

## 2019-05-28 DIAGNOSIS — K029 Dental caries, unspecified: Secondary | ICD-10-CM | POA: Insufficient documentation

## 2019-05-28 DIAGNOSIS — K0889 Other specified disorders of teeth and supporting structures: Secondary | ICD-10-CM | POA: Insufficient documentation

## 2019-05-28 DIAGNOSIS — F17210 Nicotine dependence, cigarettes, uncomplicated: Secondary | ICD-10-CM | POA: Diagnosis not present

## 2019-05-28 DIAGNOSIS — Z85038 Personal history of other malignant neoplasm of large intestine: Secondary | ICD-10-CM | POA: Insufficient documentation

## 2019-05-28 MED ORDER — NAPROXEN 500 MG PO TABS: 500.0000 mg | ORAL_TABLET | Freq: Two times a day (BID) | ORAL | 0 refills | Status: DC

## 2019-05-28 MED ORDER — AMOXICILLIN 500 MG PO CAPS
1000.0000 mg | ORAL_CAPSULE | Freq: Once | ORAL | Status: AC
  Administered 2019-05-28: 1000 mg via ORAL
  Filled 2019-05-28: qty 2

## 2019-05-28 MED ORDER — NAPROXEN 250 MG PO TABS
500.0000 mg | ORAL_TABLET | Freq: Once | ORAL | Status: AC
  Administered 2019-05-28: 500 mg via ORAL
  Filled 2019-05-28: qty 2

## 2019-05-28 MED ORDER — AMOXICILLIN 500 MG PO CAPS: 500.0000 mg | ORAL_CAPSULE | Freq: Three times a day (TID) | ORAL | 0 refills | Status: AC

## 2019-05-28 NOTE — ED Triage Notes (Signed)
Pt reports dental pain "I have many breakages" since 3 weeks ago. She reports feeling a small "knot" on her left side of neck under her jaw.

## 2019-05-28 NOTE — ED Provider Notes (Signed)
[facility] EMERGENCY DEPARTMENT Provider Note   CSN: [id number] Arrival date & time: 05/28/19  1331     History   Chief Complaint Chief Complaint  Patient presents with  . Dental Pain    HPI Carol Dominguez is a 68 y.o. female.     Patient is a 68 year old female with past medical history of asthma, anemia, presenting to the emergency department for dental pain.  Patient has a long history of dental decay and poor oral hygiene.  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  Reports that she has had increased pain in her mouth with her cavities and feels like the pain is flaring up.  Denies any fever, chills, nausea, vomiting, difficulty swallowing or breathing.  Has not tried anything for relief.     Past Medical History:  Diagnosis Date  . Anemia   . Asthma   . Cancer ([place]) 04/2015   colon cancer   . Depression   . Foot fracture, right   . Iron deficiency   . Ovarian cyst 1975   [facility]  . Teeth decayed   . Tobacco use   . 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 Influenza with pneumonia   . AKI (acute kidney injury) ([place]) 09/22/2016  . Community acquired pneumonia 09/22/2016  . Dehydration 09/22/2016  . Protein-calorie malnutrition, moderate ([place]) 04/14/2015  . Claustrophobia 04/09/2015  . Internal complete rectal prolapse with intussusception of rectosigmoid 04/09/2015  . Tobacco use   . Rectal prolapse 04/08/2015  . Intussusception of colon mass into rectum with obstruction s/p lap LAR / rectopexy 04/09/2015 04/08/2015    Past Surgical History:  Procedure Laterality Date  . BREAST EXCISIONAL BIOPSY Right   . COLON SURGERY  2016  . COLONOSCOPY    . CYST EXCISION Bilateral 1973   Right ac and left wrist   . DILATION AND CURETTAGE OF UTERUS    . INGUINAL HERNIA REPAIR Right    [age] y/o - Dr. Ronnald Ramp  . LAPAROSCOPIC  LOW ANTERIOR RESECTION N/A 04/09/2015   Procedure: LAPAROSCOPIC LOW ANTERIOR RESECTION;  Surgeon: Michael Boston, MD;  Location: [facility];  Service: General;  Laterality: N/A;  . OVARIAN CYST REMOVAL Right 1975   Dr. Ronnald Ramp  . RECTOPEXY N/A 04/09/2015   Procedure: RECTOPEXY;  Surgeon: Michael Boston, MD;  Location: [facility];  Service: General;  Laterality: N/A;     OB History   No obstetric history on file.      Home Medications    Prior to Admission medications   Medication Sig Start Date End Date Taking? Authorizing Provider  amoxicillin (AMOXIL) 500 MG capsule Take 1 capsule (500 mg total) by mouth 3 (three) times daily for 10 days. 05/28/19 06/07/19  Madilyn Hook A, PA-C  naproxen (NAPROSYN) 500 MG tablet Take 1 tablet (500 mg total) by mouth 2 (two) times daily. 05/28/19   Alveria Apley, PA-C    Family History Family History  Problem Relation Age of Onset  . Pancreatic cancer Mother   . Aneurysm Father   . Other Sister   . Sickle cell anemia Brother   . Colon cancer Neg Hx   . Esophageal cancer Neg Hx   . Stomach cancer Neg Hx     Social History Social History   Tobacco Use  . Smoking status: Light Tobacco Smoker  Packs/day: 0.25    Types: Cigarettes  . Smokeless tobacco: Never Used  . Tobacco comment: occasional  Substance Use Topics  . Alcohol use: Yes    Alcohol/week: 0.0 standard drinks    Comment: rare  . Drug use: No     Allergies   Codeine   Review of Systems Review of Systems  Constitutional: Positive for appetite change. Negative for fever.  HENT: Positive for dental problem. Negative for congestion, drooling, ear pain, rhinorrhea, sinus pressure, sinus pain, sneezing, sore throat and trouble swallowing.   Respiratory: Negative for cough and shortness of breath.   Cardiovascular: Negative for chest pain.  Musculoskeletal: Negative for neck stiffness.  Skin: Negative for rash and wound.  Allergic/Immunologic: Negative for immunocompromised state.      Physical Exam Updated Vital Signs BP (!) 151/95 (BP Location: Left Arm)   Pulse 71   Temp 98.5 F (36.9 C) (Oral)   Resp 14   Ht 5\' 8"  (1.727 m)   Wt 54.4 kg   SpO2 100%   BMI 18.25 kg/m   Physical Exam Vitals signs and nursing note reviewed.  Constitutional:      Appearance: Normal appearance.  HENT:     Head: Normocephalic.     Mouth/Throat:     Mouth: Mucous membranes are moist.     Tongue: No lesions.     Pharynx: Oropharynx is clear. Uvula midline. No pharyngeal swelling, oropharyngeal exudate, posterior oropharyngeal erythema or uvula swelling.     Tonsils: No tonsillar exudate or tonsillar abscesses.     Comments: Severe dental decay throughout with erythematous gums and scattered periapical abscess.  No large abscess, no peritonsillar abscess, no swelling of the lips, tongue, throat. Eyes:     Conjunctiva/sclera: Conjunctivae normal.  Pulmonary:     Effort: Pulmonary effort is normal.  Skin:    General: Skin is dry.  Neurological:     Mental Status: She is alert.  Psychiatric:        Mood and Affect: Mood normal.      ED Treatments / Results  Labs (all labs ordered are listed, but only abnormal results are displayed) Labs Reviewed - No data to display  EKG None  Radiology No results found.  Procedures Procedures (including critical care time)  Medications Ordered in ED Medications  amoxicillin (AMOXIL) capsule 1,000 mg (1,000 mg Oral Given 05/28/19 1511)  naproxen (NAPROSYN) tablet 500 mg (500 mg Oral Given 05/28/19 1511)     Initial Impression / Assessment and Plan / ED Course  I have reviewed the triage vital signs and the nursing notes.  Pertinent labs & imaging results that were available during my care of the patient were reviewed by me and considered in my medical decision making (see chart for details).  Clinical Course as of May 27 1529  Wed May 28, 2019  1454 Patient with severe dental caries and erythema.  No signs of airway  compromise or large dental abscess.  Will start her on amoxicillin and naproxen.  Given dental resources to see a dentist.  Advised on strict return precautions.   [KM]    Clinical Course User Index [KM] Alveria Apley, PA-C       Based on review of vitals, medical screening exam, lab work and/or imaging, there does not appear to be an acute, emergent etiology for the patient's symptoms. Counseled pt on good return precautions and encouraged both PCP and ED follow-up as needed.  Prior to discharge, I also discussed incidental  imaging findings with patient in detail and advised appropriate, recommended follow-up in detail.  Clinical Impression: 1. Pain, dental   2. Dental caries   3. Pain due to dental caries     Disposition: Discharge  Prior to providing a prescription for a controlled substance, I independently reviewed the patient's recent prescription history on the [facility]. The patient had no recent or regular prescriptions and was deemed appropriate for a brief, less than 3 day prescription of narcotic for acute analgesia.  This note was prepared with assistance of Systems analyst. Occasional wrong-word or sound-a-like substitutions may have occurred due to the inherent limitations of voice recognition software.   Final Clinical Impressions(s) / ED Diagnoses   Final diagnoses:  Pain, dental  Dental caries  Pain due to dental caries    ED Discharge Orders         Ordered    amoxicillin (AMOXIL) 500 MG capsule  3 times daily     05/28/19 1530    naproxen (NAPROSYN) 500 MG tablet  2 times daily     05/28/19 1530           Kristine Royal 05/28/19 [place], [place], DO 06/02/19 1614

## 2019-05-28 NOTE — ED Notes (Signed)
Kelly PA at bedside   

## 2019-05-28 NOTE — ED Notes (Signed)
Pt given ice water,

## 2019-05-28 NOTE — Discharge Instructions (Signed)
Thank you for allowing me to care for you today. Please return to the emergency department if you have new or worsening symptoms. Take your medications as instructed.  ° °

## 2019-06-27 ENCOUNTER — Other Ambulatory Visit: Payer: Self-pay | Admitting: Family

## 2019-06-27 DIAGNOSIS — R921 Mammographic calcification found on diagnostic imaging of breast: Secondary | ICD-10-CM

## 2019-08-11 ENCOUNTER — Inpatient Hospital Stay: Admission: RE | Admit: 2019-08-11 | Payer: Medicare HMO | Source: Ambulatory Visit

## 2019-09-05 ENCOUNTER — Ambulatory Visit: Payer: Medicare HMO

## 2019-09-14 ENCOUNTER — Ambulatory Visit: Payer: Medicare (Managed Care) | Attending: Internal Medicine

## 2019-09-14 DIAGNOSIS — Z23 Encounter for immunization: Secondary | ICD-10-CM | POA: Insufficient documentation

## 2019-09-14 NOTE — Progress Notes (Signed)
   Covid-19 Vaccination Clinic  Name:  [name]    MRN: [id number] DOB: 1951/06/19  09/14/2019  Ms. Carol Dominguez was observed post Covid-19 immunization for 15 minutes without incidence. She was provided with Vaccine Information Sheet and instruction to access the V-Safe system.   Ms. Carol Dominguez was instructed to call 911 with any severe reactions post vaccine: Marland Kitchen Difficulty breathing  . Swelling of your face and throat  . A fast heartbeat  . A bad rash all over your body  . Dizziness and weakness    Immunizations Administered    Name Date Dose VIS Date Route   Pfizer COVID-19 Vaccine 09/14/2019  1:14 PM 0.3 mL 07/18/2019 Intramuscular   Manufacturer: [place]   Lot: [id number]   [place]: [id number]

## 2019-10-09 ENCOUNTER — Ambulatory Visit: Payer: Medicare (Managed Care) | Attending: Internal Medicine

## 2019-10-09 DIAGNOSIS — Z23 Encounter for immunization: Secondary | ICD-10-CM | POA: Insufficient documentation

## 2019-10-09 NOTE — Progress Notes (Signed)
   Covid-19 Vaccination Clinic  Name:  Carol Dominguez    MRN: [id number] DOB: January 17, 1951  10/09/2019  Carol Dominguez was observed post Covid-19 immunization for 15 minutes without incident. She was provided with Vaccine Information Sheet and instruction to access the V-Safe system.   Carol Dominguez was instructed to call 911 with any severe reactions post vaccine: Marland Kitchen Difficulty breathing  . Swelling of face and throat  . A fast heartbeat  . A bad rash all over body  . Dizziness and weakness   Immunizations Administered    Name Date Dose VIS Date Route   Pfizer COVID-19 Vaccine 10/09/2019  1:34 PM 0.3 mL 07/18/2019 Intramuscular   Manufacturer: [place]   Lot: [id number]   [place]: [id number]

## 2022-04-27 ENCOUNTER — Encounter (HOSPITAL_COMMUNITY): Payer: Self-pay

## 2022-04-27 ENCOUNTER — Emergency Department (HOSPITAL_COMMUNITY)
Admission: EM | Admit: 2022-04-27 | Discharge: 2022-04-27 | Disposition: A | Discharge status: Home / Self Care | Payer: Medicare Other | Attending: Emergency Medicine | Admitting: Emergency Medicine

## 2022-04-27 ENCOUNTER — Emergency Department (HOSPITAL_COMMUNITY): Payer: Medicare Other

## 2022-04-27 ENCOUNTER — Other Ambulatory Visit: Payer: Self-pay

## 2022-04-27 DIAGNOSIS — N390 Urinary tract infection, site not specified: Secondary | ICD-10-CM | POA: Insufficient documentation

## 2022-04-27 DIAGNOSIS — Z85038 Personal history of other malignant neoplasm of large intestine: Secondary | ICD-10-CM | POA: Diagnosis not present

## 2022-04-27 DIAGNOSIS — R42 Dizziness and giddiness: Secondary | ICD-10-CM | POA: Diagnosis not present

## 2022-04-27 DIAGNOSIS — B9689 Other specified bacterial agents as the cause of diseases classified elsewhere: Secondary | ICD-10-CM | POA: Insufficient documentation

## 2022-04-27 DIAGNOSIS — M79606 Pain in leg, unspecified: Secondary | ICD-10-CM | POA: Diagnosis not present

## 2022-04-27 DIAGNOSIS — R531 Weakness: Secondary | ICD-10-CM | POA: Insufficient documentation

## 2022-04-27 DIAGNOSIS — R82998 Other abnormal findings in urine: Secondary | ICD-10-CM | POA: Diagnosis present

## 2022-04-27 LAB — CBC WITH DIFFERENTIAL/PLATELET
Abs Immature Granulocytes: 0.01 10*3/uL (ref 0.00–0.07)
Basophils Absolute: 0.1 10*3/uL (ref 0.0–0.1)
Basophils Relative: 1 %
Eosinophils Absolute: 0.2 10*3/uL (ref 0.0–0.5)
Eosinophils Relative: 3 %
HCT: 43.8 % (ref 36.0–46.0)
Hemoglobin: 14.2 g/dL (ref 12.0–15.0)
Immature Granulocytes: 0 %
Lymphocytes Relative: 37 %
Lymphs Abs: 1.8 10*3/uL (ref 0.7–4.0)
MCH: 30.5 pg (ref 26.0–34.0)
MCHC: 32.4 g/dL (ref 30.0–36.0)
MCV: 94.2 fL (ref 80.0–100.0)
Monocytes Absolute: 0.3 10*3/uL (ref 0.1–1.0)
Monocytes Relative: 6 %
Neutro Abs: 2.6 10*3/uL (ref 1.7–7.7)
Neutrophils Relative %: 53 %
Platelets: 242 10*3/uL (ref 150–400)
RBC: 4.65 MIL/uL (ref 3.87–5.11)
RDW: 13.2 % (ref 11.5–15.5)
WBC: 5 10*3/uL (ref 4.0–10.5)
nRBC: 0 % (ref 0.0–0.2)

## 2022-04-27 LAB — I-STAT CHEM 8, ED
BUN: 14 mg/dL (ref 8–23)
Calcium, Ion: 1.16 mmol/L (ref 1.15–1.40)
Chloride: 105 mmol/L (ref 98–111)
Creatinine, Ser: 0.9 mg/dL (ref 0.44–1.00)
Glucose, Bld: 87 mg/dL (ref 70–99)
HCT: 44 % (ref 36.0–46.0)
Hemoglobin: 15 g/dL (ref 12.0–15.0)
Potassium: 4.3 mmol/L (ref 3.5–5.1)
Sodium: 143 mmol/L (ref 135–145)
TCO2: 29 mmol/L (ref 22–32)

## 2022-04-27 LAB — COMPREHENSIVE METABOLIC PANEL
ALT: 14 U/L (ref 0–44)
AST: 16 U/L (ref 15–41)
Albumin: 3.9 g/dL (ref 3.5–5.0)
Alkaline Phosphatase: 65 U/L (ref 38–126)
Anion gap: 7 (ref 5–15)
BUN: 16 mg/dL (ref 8–23)
CO2: 27 mmol/L (ref 22–32)
Calcium: 9.5 mg/dL (ref 8.9–10.3)
Chloride: 108 mmol/L (ref 98–111)
Creatinine, Ser: 0.96 mg/dL (ref 0.44–1.00)
GFR, Estimated: >60 mL/min (ref >60)
Glucose, Bld: 93 mg/dL (ref 70–99)
Potassium: 4.3 mmol/L (ref 3.5–5.1)
Sodium: 142 mmol/L (ref 135–145)
Total Bilirubin: 0.4 mg/dL (ref 0.3–1.2)
Total Protein: 7.3 g/dL (ref 6.5–8.1)

## 2022-04-27 LAB — URINALYSIS, ROUTINE W REFLEX MICROSCOPIC
Bilirubin Urine: NEGATIVE
Glucose, UA: NEGATIVE mg/dL
Ketones, ur: NEGATIVE mg/dL
Nitrite: POSITIVE — AB
Protein, ur: 30 mg/dL — AB
Specific Gravity, Urine: 1.023 (ref 1.005–1.030)
Squamous Epithelial / HPF: >50 — ABNORMAL HIGH (ref 0–5)
WBC, UA: >50 WBC/hpf — ABNORMAL HIGH (ref 0–5)
pH: 5 (ref 5.0–8.0)

## 2022-04-27 LAB — TROPONIN I (HIGH SENSITIVITY): Troponin I (High Sensitivity): 3 ng/L (ref <18)

## 2022-04-27 MED ORDER — SODIUM CHLORIDE 0.9 % IV BOLUS
1000.0000 mL | Freq: Once | INTRAVENOUS | Status: AC
  Administered 2022-04-27: 1000 mL via INTRAVENOUS

## 2022-04-27 MED ORDER — SODIUM CHLORIDE 0.9 % IV SOLN
2.0000 g | Freq: Once | INTRAVENOUS | Status: AC
  Administered 2022-04-27: 2 g via INTRAVENOUS
  Filled 2022-04-27: qty 20

## 2022-04-27 MED ORDER — CEPHALEXIN 500 MG PO CAPS: 500.0000 mg | ORAL_CAPSULE | Freq: Four times a day (QID) | ORAL | 0 refills | Status: DC

## 2022-04-27 MED ORDER — IOHEXOL 350 MG/ML SOLN
75.0000 mL | Freq: Once | INTRAVENOUS | Status: AC | PRN
  Administered 2022-04-27: 75 mL via INTRAVENOUS

## 2022-04-27 MED ORDER — ONDANSETRON HCL 4 MG/2ML IJ SOLN
4.0000 mg | Freq: Once | INTRAMUSCULAR | Status: AC
  Administered 2022-04-27: 4 mg via INTRAVENOUS
  Filled 2022-04-27: qty 2

## 2022-04-27 MED ORDER — MECLIZINE HCL 25 MG PO TABS
25.0000 mg | ORAL_TABLET | Freq: Once | ORAL | Status: AC
  Administered 2022-04-27: 25 mg via ORAL
  Filled 2022-04-27: qty 1

## 2022-04-27 NOTE — ED Provider Notes (Signed)
[facility] DEPT Provider Note   CSN: [id number] Arrival date & time: 04/27/22  1419     History  Chief Complaint  Patient presents with   Dizziness   Leg Pain   Nausea    Aura Carol Dominguez is a 71 y.o. female.  Patient has a past medical history of colon cancer.  Patient complains of weakness and dizziness  The history is provided by the patient and medical records. No language interpreter was used.  Dizziness Quality:  Lightheadedness Severity:  Mild Onset quality:  Sudden Timing:  Intermittent Progression:  Waxing and waning Chronicity:  New Relieved by:  Nothing Worsened by:  Nothing Associated symptoms: weakness   Associated symptoms: no chest pain, no diarrhea and no headaches   Leg Pain Associated symptoms: no back pain and no fatigue        Home Medications Prior to Admission medications   Medication Sig Start Date End Date Taking? Authorizing Provider  cephALEXin (KEFLEX) 500 MG capsule Take 1 capsule (500 mg total) by mouth 4 (four) times daily. 04/27/22  Yes Milton Ferguson, MD  naproxen (NAPROSYN) 500 MG tablet Take 1 tablet (500 mg total) by mouth 2 (two) times daily. 05/28/19   Alveria Apley, PA-C      Allergies    Codeine    Review of Systems   Review of Systems  Constitutional:  Negative for appetite change and fatigue.  HENT:  Negative for congestion, ear discharge and sinus pressure.   Eyes:  Negative for discharge.  Respiratory:  Negative for cough.   Cardiovascular:  Negative for chest pain.  Gastrointestinal:  Negative for abdominal pain and diarrhea.  Genitourinary:  Negative for frequency and hematuria.  Musculoskeletal:  Negative for back pain.  Skin:  Negative for rash.  Neurological:  Positive for dizziness and weakness. Negative for seizures and headaches.  Psychiatric/Behavioral:  Negative for hallucinations.     Physical Exam Updated Vital Signs BP (!) 162/86   Pulse 76   Temp  98.2 F (36.8 C) (Oral)   Resp (!) 22   Ht '5\' 5"'$  (1.651 m)   Wt 65.8 kg   SpO2 96%   BMI 24.13 kg/m  Physical Exam Vitals and nursing note reviewed.  Constitutional:      Appearance: She is well-developed.  HENT:     Head: Normocephalic.     Nose: Nose normal.  Eyes:     General: No scleral icterus.    Conjunctiva/sclera: Conjunctivae normal.  Neck:     Thyroid: No thyromegaly.  Cardiovascular:     Rate and Rhythm: Normal rate and regular rhythm.     Heart sounds: No murmur heard.    No friction rub. No gallop.  Pulmonary:     Breath sounds: No stridor. No wheezing or rales.  Chest:     Chest wall: No tenderness.  Abdominal:     General: There is no distension.     Tenderness: There is no abdominal tenderness. There is no rebound.  Musculoskeletal:        General: Normal range of motion.     Cervical back: Neck supple.  Lymphadenopathy:     Cervical: No cervical adenopathy.  Skin:    Findings: No erythema or rash.  Neurological:     Mental Status: She is alert and oriented to person, place, and time.     Motor: No abnormal muscle tone.     Coordination: Coordination normal.  Psychiatric:  Behavior: Behavior normal.     ED Results / Procedures / Treatments   Labs (all labs ordered are listed, but only abnormal results are displayed) Labs Reviewed  URINALYSIS, ROUTINE W REFLEX MICROSCOPIC - Abnormal; Notable for the following components:      Result Value   APPearance CLOUDY (*)    Hgb urine dipstick SMALL (*)    Protein, ur 30 (*)    Nitrite POSITIVE (*)    Leukocytes,Ua LARGE (*)    WBC, UA >50 (*)    Bacteria, UA MANY (*)    Squamous Epithelial / LPF >50 (*)    All other components within normal limits  URINE CULTURE  CBC WITH DIFFERENTIAL/PLATELET  COMPREHENSIVE METABOLIC PANEL  I-STAT CHEM 8, ED  TROPONIN I (HIGH SENSITIVITY)  TROPONIN I (HIGH SENSITIVITY)    EKG EKG Interpretation  Date/Time:  Thursday April 27 2022 18:27:35  EDT Ventricular Rate:  69 PR Interval:  123 QRS Duration: 77 QT Interval:  415 QTC Calculation: 445 R Axis:   78 Text Interpretation: Sinus rhythm Biatrial enlargement similar to prior no stemi Confirmed by Wynona Dove ([id number]) on 04/27/2022 6:30:13 PM  Radiology CT ANGIO HEAD NECK W WO CM  Result Date: 04/27/2022 CLINICAL DATA:  Initial evaluation for dizziness. EXAM: CT ANGIOGRAPHY HEAD AND NECK TECHNIQUE: Multidetector CT imaging of the head and neck was performed using the standard protocol during bolus administration of intravenous contrast. Multiplanar CT image reconstructions and MIPs were obtained to evaluate the vascular anatomy. Carotid stenosis measurements (when applicable) are obtained utilizing NASCET criteria, using the distal internal carotid diameter as the denominator. RADIATION DOSE REDUCTION: This exam was performed according to the departmental dose-optimization program which includes automated exposure control, adjustment of the mA and/or kV according to patient size and/or use of iterative reconstruction technique. CONTRAST:  74m OMNIPAQUE IOHEXOL 350 MG/ML SOLN COMPARISON:  None Available. FINDINGS: CT HEAD FINDINGS Brain: Age-related cerebral atrophy with chronic small vessel ischemic disease. No evidence for acute intracranial hemorrhage. No findings to suggest acute large vessel territory infarct. No mass lesion, midline shift, or mass effect. Ventricles are normal in size without evidence for hydrocephalus. No extra-axial fluid collection identified. Vascular: No hyperdense vessel identified.Scattered vascular calcifications noted within the carotid siphons. Skull: Scalp soft tissues demonstrate no acute abnormality. Calvarium intact. Sinuses/Orbits: Globes and orbital soft tissues within normal limits. Visualized paranasal sinuses are clear. No mastoid effusion. CTA NECK FINDINGS Aortic arch: Partially visualized aortic arch normal in caliber. Origin of the great vessels  incompletely assessed on this exam. Right carotid system: Right common and internal carotid arteries patent without dissection or occlusion. Atheromatous change about the right carotid bulb without hemodynamically significant greater than 50% stenosis. Left carotid system: Left common and internal carotid arteries patent without dissection or occlusion. Atheromatous change about the left carotid bulb without hemodynamically significant greater than 50% stenosis. Vertebral arteries: Both vertebral arteries arise from subclavian arteries. No visible proximal subclavian artery stenosis. Vertebral arteries patent without stenosis or dissection. Skeleton: No discrete or worrisome osseous lesions. Moderate spondylosis present at C5-6 and C6-7. Degenerative changes present about the TMJs. Poor dentition noted. Other neck: No other acute soft tissue abnormality within the neck. Upper chest: Advanced emphysema. Visualized upper chest demonstrates no other acute finding. Review of the MIP images confirms the above findings CTA HEAD FINDINGS Anterior circulation: Petrous segments patent bilaterally. Mild atheromatous change within the carotid siphons without hemodynamically significant stenosis. A1 segments, anterior communicating artery complex common anterior cerebral arteries widely  patent. No M1 stenosis or occlusion. No proximal MCA branch occlusion. Distal MCA branches perfused and symmetric. Posterior circulation: Both V4 segments widely patent. Left vertebral artery slightly dominant. Right PICA patent. Left PICA origin not well seen. Basilar widely patent to its distal aspect. Superior cerebellar and posterior cerebral arteries patent bilaterally. Venous sinuses: Patent allowing for timing the contrast bolus. Anatomic variants: None significant.  No aneurysm. Review of the MIP images confirms the above findings IMPRESSION: 1. Negative CTA of the head and neck for large vessel occlusion or other emergent finding. 2.  Mild-to-moderate atheromatous change about the carotid bifurcations and carotid siphons, but no hemodynamically significant or correctable stenosis. 3. No other acute intracranial abnormality. 4. Age-related cerebral atrophy with chronic small vessel ischemic disease. Emphysema (ICD10-J43.9). Electronically Signed   By: Jeannine Boga M.D.   On: 04/27/2022 20:38    Procedures Procedures    Medications Ordered in ED Medications  ondansetron (ZOFRAN) injection 4 mg (4 mg Intravenous Given 04/27/22 1850)  meclizine (ANTIVERT) tablet 25 mg (25 mg Oral Given 04/27/22 1850)  sodium chloride 0.9 % bolus 1,000 mL (0 mLs Intravenous Stopped 04/27/22 2027)  iohexol (OMNIPAQUE) 350 MG/ML injection 75 mL (75 mLs Intravenous Contrast Given 04/27/22 1954)  cefTRIAXone (ROCEPHIN) 2 g in sodium chloride 0.9 % 100 mL IVPB (0 g Intravenous Stopped 04/27/22 2132)    ED Course/ Medical Decision Making/ A&P                           Medical Decision Making Amount and/or Complexity of Data Reviewed Labs: ordered.  Risk Prescription drug management.  This patient presents to the ED for concern of weakness and dyspnea, this involves an extensive number of treatment options, and is a complaint that carries with it a high risk of complications and morbidity.  The differential diagnosis includes sepsis, worsening colon cancer   Co morbidities that complicate the patient evaluation  Colon cancer   Additional history obtained:  Additional history obtained from the patient External records from outside source obtained and reviewed including hospital record   Lab Tests:  I Ordered, and personally interpreted labs.  The pertinent results include: Urinalysis shows urinary tract infection, CBC and chemistry unremarkable   Imaging Studies ordered:  I ordered imaging studies including CT angio head neck I independently visualized and interpreted imaging which showed no acute disease I agree with the  radiologist interpretation   Cardiac Monitoring: / EKG:  The patient was maintained on a cardiac monitor.  I personally viewed and interpreted the cardiac monitored which showed an underlying rhythm of: Normal sinus rhythm   Consultations Obtained: No consultant Problem List / ED Course / Critical interventions / Medication management  Dizziness and weakness and colon cancer I ordered medication including Rocephin for UTI Reevaluation of the patient after these medicines showed that the patient stayed the same I have reviewed the patients home medicines and have made adjustments as needed   Social Determinants of Health:  None   Test / Admission - Considered:  No additional test needed  Patient has a urinary tract infection.  She will be discharged on Keflex and follow-up with PCP        Final Clinical Impression(s) / ED Diagnoses Final diagnoses:  Lower urinary tract infectious disease    Rx / DC Orders ED Discharge Orders          Ordered    cephALEXin (KEFLEX) 500 MG capsule  4  times daily        04/27/22 2157              Milton Ferguson, MD 04/29/22 1134

## 2022-04-27 NOTE — ED Notes (Addendum)
Pt ambulated to restroom with a walker. She states she is feeling better.

## 2022-04-27 NOTE — ED Notes (Signed)
Needs IV

## 2022-04-27 NOTE — ED Notes (Signed)
Food given. PT very appreciative.

## 2022-04-27 NOTE — ED Provider Triage Note (Signed)
Emergency Medicine Provider Triage Evaluation Note  Carol Dominguez , a 71 y.o. female  was evaluated in triage.  Pt complains of dizziness for the past 4-5 days. Denies any blurry vision. She reports intermittent pain in her left leg as well. Denies any weakness. Denies any fever. Reports some intermittent nausea, but denies abdominal pain. She reports that her room spinning sensation is worse with movement of her head.   Review of Systems  Positive:  Negative:   Physical Exam  BP (!) 152/70 (BP Location: Left Arm)   Pulse 69   Temp 98.3 F (36.8 C) (Oral)   Resp 17   Ht '5\' 5"'$  (1.651 m)   Wt 65.8 kg   SpO2 98%   BMI 24.13 kg/m  Gen:   Awake, no distress   Resp:  Normal effort  MSK:   Moves extremities without difficulty  Other:  Cranial nerves 2-12 grossly intact. Moving all extremities. Stength appears symmetric in [place]  Medically screening exam initiated at 4:55 PM.  Appropriate orders placed.  Kristie Cowman Zabriskie was informed that the remainder of the evaluation will be completed by another provider, this initial triage assessment does not replace that evaluation, and the importance of remaining in the ED until their evaluation is complete.  CT ordered.    Sherrell Puller, [place] 04/27/22 1702

## 2022-04-27 NOTE — ED Triage Notes (Signed)
Per EMS-Patient c/o dizziness, right leg pain, and nausea.

## 2022-04-27 NOTE — ED Notes (Signed)
Pt is trying to call for a ride

## 2022-04-27 NOTE — ED Notes (Signed)
Pt was able to find a ride. Wheeled to waiting room when ride arrived.

## 2022-04-27 NOTE — Discharge Instructions (Signed)
Drink plenty of fluids and follow-up with your family doctor next week. °

## 2022-04-29 LAB — URINE CULTURE: Culture: >=100000 — AB

## 2022-04-30 ENCOUNTER — Telehealth (HOSPITAL_BASED_OUTPATIENT_CLINIC_OR_DEPARTMENT_OTHER): Payer: Self-pay | Admitting: *Deleted

## 2022-04-30 NOTE — Telephone Encounter (Signed)
Post ED Visit - Positive Culture Follow-up  Culture report reviewed by antimicrobial stewardship pharmacist: [place] Team '[]'$  Elenor Quinones, Pharm.D. '[]'$  Heide Guile, Pharm.D., BCPS AQ-ID '[]'$  Parks Neptune, Pharm.D., BCPS '[]'$  Alycia Rossetti, Pharm.D., BCPS '[]'$  [place], [place].D., BCPS, AAHIVP '[]'$  Legrand Como, Pharm.D., BCPS, AAHIVP '[]'$  Salome Arnt, PharmD, BCPS '[]'$  Johnnette Gourd, PharmD, BCPS '[]'$  Hughes Better, PharmD, BCPS '[]'$  Leeroy Cha, PharmD '[]'$  Laqueta Linden, PharmD, BCPS '[]'$  Albertina Parr, PharmD  [place] Team '[]'$  Leodis Sias, PharmD '[]'$  Lindell Spar, PharmD '[]'$  Royetta Asal, PharmD '[]'$  Graylin Shiver, Rph '[]'$  Rema Fendt) Glennon Mac, PharmD '[x]'$  Arlyn Dunning, PharmD '[]'$  Netta Cedars, PharmD '[]'$  Dia Sitter, PharmD '[]'$  Leone Haven, PharmD '[]'$  Gretta Arab, PharmD '[]'$  Theodis Shove, PharmD '[]'$  Peggyann Juba, PharmD '[]'$  Reuel Boom, PharmD   Positive urine culture Treated with Cephalexin, organism sensitive to the same and no further patient follow-up is required at this time.  Carol Dominguez 04/30/2022, 11:15 AM

## 2023-05-07 ENCOUNTER — Emergency Department (HOSPITAL_COMMUNITY)
Admission: EM | Admit: 2023-05-07 | Discharge: 2023-05-07 | Disposition: A | Discharge status: Home / Self Care | Payer: Medicare PPO | Attending: Emergency Medicine | Admitting: Emergency Medicine

## 2023-05-07 ENCOUNTER — Other Ambulatory Visit: Payer: Self-pay

## 2023-05-07 ENCOUNTER — Emergency Department (HOSPITAL_COMMUNITY): Payer: Medicare PPO

## 2023-05-07 ENCOUNTER — Encounter (HOSPITAL_COMMUNITY): Payer: Self-pay

## 2023-05-07 DIAGNOSIS — J168 Pneumonia due to other specified infectious organisms: Secondary | ICD-10-CM | POA: Insufficient documentation

## 2023-05-07 DIAGNOSIS — J45901 Unspecified asthma with (acute) exacerbation: Secondary | ICD-10-CM | POA: Diagnosis not present

## 2023-05-07 DIAGNOSIS — K047 Periapical abscess without sinus: Secondary | ICD-10-CM | POA: Insufficient documentation

## 2023-05-07 DIAGNOSIS — J189 Pneumonia, unspecified organism: Secondary | ICD-10-CM

## 2023-05-07 DIAGNOSIS — Z91040 Latex allergy status: Secondary | ICD-10-CM | POA: Diagnosis not present

## 2023-05-07 DIAGNOSIS — Z20822 Contact with and (suspected) exposure to covid-19: Secondary | ICD-10-CM | POA: Insufficient documentation

## 2023-05-07 DIAGNOSIS — R0602 Shortness of breath: Secondary | ICD-10-CM | POA: Diagnosis present

## 2023-05-07 LAB — CBC
HCT: 40.7 % (ref 36.0–46.0)
Hemoglobin: 13.2 g/dL (ref 12.0–15.0)
MCH: 31.1 pg (ref 26.0–34.0)
MCHC: 32.4 g/dL (ref 30.0–36.0)
MCV: 96 fL (ref 80.0–100.0)
Platelets: 253 10*3/uL (ref 150–400)
RBC: 4.24 MIL/uL (ref 3.87–5.11)
RDW: 13.7 % (ref 11.5–15.5)
WBC: 7.1 10*3/uL (ref 4.0–10.5)
nRBC: 0 % (ref 0.0–0.2)

## 2023-05-07 LAB — COMPREHENSIVE METABOLIC PANEL
ALT: 23 U/L (ref 0–44)
AST: 21 U/L (ref 15–41)
Albumin: 4 g/dL (ref 3.5–5.0)
Alkaline Phosphatase: 71 U/L (ref 38–126)
Anion gap: 12 (ref 5–15)
BUN: 18 mg/dL (ref 8–23)
CO2: 24 mmol/L (ref 22–32)
Calcium: 9.3 mg/dL (ref 8.9–10.3)
Chloride: 105 mmol/L (ref 98–111)
Creatinine, Ser: 1.01 mg/dL — ABNORMAL HIGH (ref 0.44–1.00)
GFR, Estimated: 60 mL/min — ABNORMAL LOW (ref >60)
Glucose, Bld: 126 mg/dL — ABNORMAL HIGH (ref 70–99)
Potassium: 3.7 mmol/L (ref 3.5–5.1)
Sodium: 141 mmol/L (ref 135–145)
Total Bilirubin: 0.6 mg/dL (ref 0.3–1.2)
Total Protein: 7.5 g/dL (ref 6.5–8.1)

## 2023-05-07 LAB — RESP PANEL BY RT-PCR (RSV, FLU A&B, COVID)  RVPGX2
Influenza A by PCR: NEGATIVE
Influenza B by PCR: NEGATIVE
Resp Syncytial Virus by PCR: NEGATIVE
SARS Coronavirus 2 by RT PCR: NEGATIVE

## 2023-05-07 LAB — TROPONIN I (HIGH SENSITIVITY)
Troponin I (High Sensitivity): 5 ng/L (ref <18)
Troponin I (High Sensitivity): 6 ng/L (ref <18)

## 2023-05-07 MED ORDER — IOHEXOL 350 MG/ML SOLN
75.0000 mL | Freq: Once | INTRAVENOUS | Status: AC | PRN
  Administered 2023-05-07: 75 mL via INTRAVENOUS

## 2023-05-07 MED ORDER — DOXYCYCLINE HYCLATE 100 MG PO CAPS: 100.0000 mg | ORAL_CAPSULE | Freq: Two times a day (BID) | ORAL | 0 refills | Status: AC

## 2023-05-07 MED ORDER — AMOXICILLIN-POT CLAVULANATE 875-125 MG PO TABS: 1.0000 | ORAL_TABLET | Freq: Two times a day (BID) | ORAL | 0 refills | Status: DC

## 2023-05-07 MED ORDER — PREDNISONE 50 MG PO TABS: 50.0000 mg | ORAL_TABLET | Freq: Every day | ORAL | 0 refills | Status: AC

## 2023-05-07 MED ORDER — ALBUTEROL SULFATE HFA 108 (90 BASE) MCG/ACT IN AERS
2.0000 | INHALATION_SPRAY | RESPIRATORY_TRACT | Status: DC | PRN
  Administered 2023-05-07: 2 via RESPIRATORY_TRACT
  Filled 2023-05-07: qty 6.7

## 2023-05-07 MED ORDER — METHYLPREDNISOLONE SODIUM SUCC 125 MG IJ SOLR
125.0000 mg | Freq: Once | INTRAMUSCULAR | Status: AC
  Administered 2023-05-07: 125 mg via INTRAVENOUS
  Filled 2023-05-07: qty 2

## 2023-05-07 NOTE — ED Triage Notes (Signed)
Pt c/o chest pain across entire chest and SOB started this morning. Pt denies N/V. Pt states can't find her inhaler at home. Pt is tachypneic.

## 2023-05-07 NOTE — ED Notes (Signed)
Pt placed on 2L 02 per Gerald

## 2023-05-07 NOTE — Discharge Instructions (Signed)
Your history, exam, and evaluation today are consistent with an asthma exacerbation likely brought on by upper respiratory illness and possible early pneumonia.  The CT scan did not show a blood clot.  Your dental pain is also consistent with an early dental infection so please take the antibiotics to cover for pneumonia and dental infection.  Please follow-up with dentistry and your PCP.  As your oxygen saturations remained normal here off of oxygen we feel you are safe for discharge home given your improvement in symptoms.  Please take the burst of steroids as well for the next few days and use the inhaler.  If any symptoms change or worsen acutely, please return to the nearest emergency department.

## 2023-05-07 NOTE — ED Provider Notes (Signed)
[place] EMERGENCY DEPARTMENT AT [facility] Provider Note   CSN: [id number] Arrival date & time: 05/07/23  1191     History  Chief Complaint  Patient presents with   Chest Pain   Shortness of Breath    Carol Dominguez is a 72 y.o. female.  The history is provided by the patient and medical records.  Chest Pain Pain location:  Substernal area and R chest Pain quality: pressure and sharp   Pain radiates to:  Does not radiate Pain severity:  Severe Onset quality:  Sudden Duration:  3 hours Timing:  Constant Progression:  Waxing and waning Chronicity:  New Context: stress   Context: not trauma   Relieved by:  Nothing Worsened by:  Deep breathing Ineffective treatments:  None tried Associated symptoms: shortness of breath   Associated symptoms: no abdominal pain, no altered mental status, no back pain, no cough (minimal), no diaphoresis, no fatigue, no fever, no headache, no lower extremity edema, no nausea, no numbness, no palpitations and no vomiting   Risk factors: no prior DVT/PE   Shortness of Breath Severity:  Severe Onset quality:  Sudden Timing:  Constant Progression:  Improving Chronicity:  New Relieved by:  Nothing Worsened by:  Nothing Ineffective treatments:  None tried Associated symptoms: chest pain and wheezing   Associated symptoms: no abdominal pain, no cough (minimal), no diaphoresis, no fever, no headaches, no neck pain, no rash and no vomiting        Home Medications Prior to Admission medications   Medication Sig Start Date End Date Taking? Authorizing Provider  BENADRYL ALLERGY 25 MG tablet Take 25 mg by mouth every 6 (six) hours as needed for allergies.    [provider]  cephALEXin (KEFLEX) 500 MG capsule Take 1 capsule (500 mg total) by mouth 4 (four) times daily. 04/27/22   Bethann Berkshire, MD  naproxen (NAPROSYN) 500 MG tablet Take 1 tablet (500 mg total) by mouth 2 (two) times daily. Patient not  taking: Reported on 04/27/2022 05/28/19   Arlyn Dunning, PA-C      Allergies    Codeine and Latex    Review of Systems   Review of Systems  Constitutional:  Negative for chills, diaphoresis, fatigue and fever.  HENT:  Negative for congestion.   Respiratory:  Positive for chest tightness, shortness of breath and wheezing. Negative for cough (minimal) and stridor.   Cardiovascular:  Positive for chest pain. Negative for palpitations.  Gastrointestinal:  Negative for abdominal pain, constipation, diarrhea, nausea and vomiting.  Genitourinary:  Negative for dysuria.  Musculoskeletal:  Negative for back pain and neck pain.  Skin:  Negative for rash.  Neurological:  Negative for numbness and headaches.  Psychiatric/Behavioral:  Negative for agitation.   All other systems reviewed and are negative.   Physical Exam Updated Vital Signs BP (!) 181/103 (BP Location: Right Arm)   Pulse 95   Temp 97.8 F (36.6 C)   Resp (!) 24   Ht 5\' 5"  (1.651 m)   Wt 65.8 kg   SpO2 92%   BMI 24.14 kg/m  Physical Exam Vitals and nursing note reviewed.  Constitutional:      General: She is not in acute distress.    Appearance: She is well-developed. She is not ill-appearing, toxic-appearing or diaphoretic.  HENT:     Head: Normocephalic and atraumatic.  Eyes:     Conjunctiva/sclera: Conjunctivae normal.     Pupils: Pupils are equal, round, and reactive to light.  Cardiovascular:     Rate and Rhythm: Normal rate and regular rhythm.     Heart sounds: Normal heart sounds. No murmur heard. Pulmonary:     Effort: Tachypnea present. No respiratory distress.     Breath sounds: Wheezing present. No rhonchi or rales.  Chest:     Chest wall: Tenderness present.  Abdominal:     Palpations: Abdomen is soft.     Tenderness: There is no abdominal tenderness.  Musculoskeletal:        General: No swelling.     Cervical back: Neck supple.     Right lower leg: No tenderness. No edema.     Left lower leg:  No tenderness. No edema.  Skin:    General: Skin is warm and dry.     Capillary Refill: Capillary refill takes less than 2 seconds.  Neurological:     General: No focal deficit present.     Mental Status: She is alert.  Psychiatric:        Mood and Affect: Mood normal.     ED Results / Procedures / Treatments   Labs (all labs ordered are listed, but only abnormal results are displayed) Labs Reviewed  COMPREHENSIVE METABOLIC PANEL - Abnormal; Notable for the following components:      Result Value   Glucose, Bld 126 (*)    Creatinine, Ser 1.01 (*)    GFR, Estimated 60 (*)    All other components within normal limits  RESP PANEL BY RT-PCR (RSV, FLU A&B, COVID)  RVPGX2  CBC  TROPONIN I (HIGH SENSITIVITY)    EKG EKG Interpretation Date/Time:  Monday May 07 2023 07:34:00 EDT Ventricular Rate:  93 PR Interval:  114 QRS Duration:  76 QT Interval:  382 QTC Calculation: 474 R Axis:   80  Text Interpretation: Normal sinus rhythm Right atrial enlargement Minimal voltage criteria for LVH, may be normal variant ( Cornell product ) Nonspecific ST abnormality Abnormal ECG When compared with ECG of 27-Apr-2022 18:27, PREVIOUS ECG IS PRESENT when compraed to prior, similar to prior No STEMI Confirmed by Theda Belfast ([id number]) on 05/07/2023 8:29:13 AM  Radiology DG Chest Port 1 View  Result Date: 05/07/2023 CLINICAL DATA:  sob EXAM: PORTABLE CHEST 1 VIEW COMPARISON:  CXR 09/22/16 FINDINGS: Small left pleural effusion. No pneumothorax. No new focal airspace opacity. There are prominent bilateral interstitial opacities that appear slightly improved from prior exam. Normal cardiac and mediastinal contours. No radiographically apparent displaced rib fracture. Visualized upper abdomen is unremarkable. IMPRESSION: 1. Small left pleural effusion. 2. Prominent bilateral interstitial opacities appear slightly improved from prior exam. Electronically Signed   By: Lorenza Cambridge M.D.   On: 05/07/2023  08:54    Procedures Procedures    Medications Ordered in ED Medications  albuterol (VENTOLIN HFA) 108 (90 Base) MCG/ACT inhaler 2 puff (2 puffs Inhalation Given 05/07/23 0743)  methylPREDNISolone sodium succinate (SOLU-MEDROL) 125 mg/2 mL injection 125 mg (125 mg Intravenous Given 05/07/23 0955)  iohexol (OMNIPAQUE) 350 MG/ML injection 75 mL (75 mLs Intravenous Contrast Given 05/07/23 1017)    ED Course/ Medical Decision Making/ A&P                                 Medical Decision Making Amount and/or Complexity of Data Reviewed Labs: ordered. Radiology: ordered.  Risk Prescription drug management.    Carol Dominguez is a 72 y.o. female with a past medical history significant for  previous colon cancer and asthma who presents with sudden onset chest pain shortness of breath overnight.  Patient reports that her husband is around the hospital and brother is sick as well and this morning she woke up at 6 AM with sudden onset of stabbing sharp chest pain across her chest with shortness of breath.  She could not take a deep breath and could not find her inhaler.  She was having significant wheezing she reports.  She reports she is felt well the last few days with no recent fevers, chills, congestion, cough, nausea, vomiting, constipation, diarrhea, or urinary changes.  Denies any new leg pain or leg swelling at this time.  She said that she did not have her inhaler and came in and was given some albuterol puffs and symptoms are starting to improve.  She is still reporting some chest discomfort across her chest.  She denies history of blood clots but is very pleuritic.  Denies any hemoptysis.  Denies any recent trauma.  On exam, lungs had some very faint wheezing but was reportedly improved from prior.  Chest was tender to palpation and back was nontender.  No murmur on my exam.  Abdomen nontender.  Good pulses in extremities.  Legs nontender and nonedematous.  Patient resting  comfortably and is on 2 L initially.  EKG does not show STEMI.  Clinically I suspect stress/anxiety versus asthma exacerbation versus some other etiology.  Given the history of cancer and the sudden onset pleuritic chest pain and shortness of breath and tachypnea, I do feel that CT imaging is necessary to rule out pulmonary embolism.  Will order this.  We will give her some steroids to go along with albuterol she already received and try to wean her off of oxygen as she does not take it normally.  Will get troponins however to rule out a cardiac etiology of symptoms.  If workup reassuring, suspect more asthma exacerbation in the setting of the stress and not having her inhaler.  Anticipate reassessment after workup to determine disposition.  10:07 AM Patient still waiting on other results.  She reportedly told nursing that she took some cocaine this morning which may have contributed to her symptoms.  Troponin negative x 2.  CT scan returned showing no pulm embolism.  Showed possible inflammatory infection changes so we will give antibiotics for possible early pneumonia.  Will give Augmentin and doxycycline to cover both dental infection and possible pneumonia.  COVID and flu negative.  Patient feeling much better and is off of all oxygen.  She was able to eat and drink without difficulty and would like to go home now.  Patient discharged in good condition for outpatient follow-up with burst of steroids for the asthma exacerbation and antibiotics.  She no questions or concerns and was discharged in good condition.        Final Clinical Impression(s) / ED Diagnoses Final diagnoses:  Dental infection  Pneumonia due to infectious organism, unspecified laterality, unspecified part of lung  Exacerbation of asthma, unspecified asthma severity, unspecified whether persistent    Rx / DC Orders ED Discharge Orders          Ordered    predniSONE (DELTASONE) 50 MG tablet  Daily with  breakfast        05/07/23 1459    amoxicillin-clavulanate (AUGMENTIN) 875-125 MG tablet  Every 12 hours,   Status:  Discontinued        05/07/23 1458    doxycycline (VIBRAMYCIN) 100 MG capsule  2 times daily        05/07/23 1458    amoxicillin-clavulanate (AUGMENTIN) 875-125 MG tablet  Every 12 hours        05/07/23 1500            Clinical Impression: 1. Dental infection   2. Pneumonia due to infectious organism, unspecified laterality, unspecified part of lung   3. Exacerbation of asthma, unspecified asthma severity, unspecified whether persistent     Disposition: Discharge  Condition: Good  I have discussed the results, Dx and Tx plan with the pt(& family if present). He/she/they expressed understanding and agree(s) with the plan. Discharge instructions discussed at great length. Strict return precautions discussed and pt &/or family have verbalized understanding of the instructions. No further questions at time of discharge.    Discharge Medication List as of 05/07/2023  3:00 PM     START taking these medications   Details  doxycycline (VIBRAMYCIN) 100 MG capsule Take 1 capsule (100 mg total) by mouth 2 (two) times daily for 7 days., Starting Mon 05/07/2023, Until Mon 05/14/2023, Print    predniSONE (DELTASONE) 50 MG tablet Take 1 tablet (50 mg total) by mouth daily with breakfast for 5 days., Starting Tue 05/08/2023, Until Sun 05/13/2023, Print        Follow Up: Raymon Mutton., FNP [address] [place] [address] [phone]     [facility] Emergency Department at [facility] [address] [place] [address] [phone]        Dionis Autry, Canary Brim, MD 05/07/23 [phone]

## 2023-06-26 ENCOUNTER — Encounter: Payer: Self-pay | Admitting: Internal Medicine

## 2023-08-14 ENCOUNTER — Ambulatory Visit (AMBULATORY_SURGERY_CENTER): Payer: Medicare PPO

## 2023-08-14 VITALS — Ht 69.0 in | Wt 120.0 lb

## 2023-08-14 DIAGNOSIS — Z85038 Personal history of other malignant neoplasm of large intestine: Secondary | ICD-10-CM

## 2023-08-14 MED ORDER — NA SULFATE-K SULFATE-MG SULF 17.5-3.13-1.6 GM/177ML PO SOLN: 1.0000 | Freq: Once | ORAL | 0 refills | Status: AC

## 2023-08-14 NOTE — Progress Notes (Signed)
 Pre visit completed via phone call; Patient verified name, DOB, and address; No egg or soy allergy known to patient;  No issues known to pt with past sedation with any surgeries or procedures; Patient denies ever being told they had issues or difficulty with intubation;  No FH of Malignant Hyperthermia; Pt is not on diet pills; Pt is not on home 02;  Pt is not on blood thinners;  Pt denies issues with constipation at this time;  No A fib or A flutter; Have any cardiac testing pending--NO Insurance verified during PV appt--- [employer] Medicare Pt can ambulate without assistance;  Pt denies use of chewing tobacco; Discussed diabetic/weight loss medication holds; Discussed NSAID holds; Checked BMI to be less than 50; Pt instructed to use [employer] or GoodRx for a price reduction on prep;  Patient's chart reviewed by Norleen Schillings CNRA prior to previsit and patient appropriate for the [facility]; Pre visit completed and red dot placed by patient's name on their procedure day (on provider's schedule);    Instructions printed and mailed to the patient per her request;

## 2023-09-06 ENCOUNTER — Encounter: Payer: Self-pay | Admitting: Internal Medicine

## 2023-09-14 ENCOUNTER — Telehealth: Payer: Self-pay | Admitting: Internal Medicine

## 2023-09-14 ENCOUNTER — Encounter: Payer: Medicare PPO | Admitting: Internal Medicine

## 2023-09-14 DIAGNOSIS — Z85038 Personal history of other malignant neoplasm of large intestine: Secondary | ICD-10-CM

## 2023-09-14 MED ORDER — PEG 3350-KCL-NA BICARB-NACL 420 G PO SOLR
4000.0000 mL | Freq: Once | ORAL | 0 refills | Status: AC
Start: 2023-09-14 — End: 2023-09-14

## 2023-09-14 NOTE — Telephone Encounter (Signed)
 Returned pts call. Rescheduled her for 10/30/23 at  3:00pm. Rx for golytely  sent to [facility].  New instructions mailed to patient.

## 2023-09-14 NOTE — Telephone Encounter (Signed)
 Inbound call from patient requesting to cancel today's colonsocopy due to being unable to afford prep medication from pharmacy. Patient does wish to reschedule but would like to discuss other options for prep. Please advise, thank you.

## 2023-10-23 ENCOUNTER — Encounter: Payer: Self-pay | Admitting: Certified Registered Nurse Anesthetist

## 2023-10-30 ENCOUNTER — Encounter: Payer: Medicare PPO | Admitting: Internal Medicine

## 2023-12-17 ENCOUNTER — Encounter: Admitting: Internal Medicine

## 2023-12-17 ENCOUNTER — Telehealth: Payer: Self-pay | Admitting: Internal Medicine

## 2023-12-17 NOTE — Telephone Encounter (Signed)
 Good Afternoon Dr Bridgett Camps,  I called this patient at 1:45pm she stated she could not remember what day it was and will call back to reschedule.   I will NO SHOW her.  [facility] Medicare

## 2024-01-06 ENCOUNTER — Other Ambulatory Visit: Payer: Self-pay

## 2024-01-06 ENCOUNTER — Emergency Department (HOSPITAL_COMMUNITY)
Admission: EM | Admit: 2024-01-06 | Discharge: 2024-01-06 | Disposition: A | Discharge status: Home / Self Care | Attending: Emergency Medicine | Admitting: Emergency Medicine

## 2024-01-06 ENCOUNTER — Encounter (HOSPITAL_COMMUNITY): Payer: Self-pay | Admitting: *Deleted

## 2024-01-06 DIAGNOSIS — K0889 Other specified disorders of teeth and supporting structures: Secondary | ICD-10-CM | POA: Diagnosis present

## 2024-01-06 DIAGNOSIS — K047 Periapical abscess without sinus: Secondary | ICD-10-CM | POA: Diagnosis not present

## 2024-01-06 DIAGNOSIS — Z720 Tobacco use: Secondary | ICD-10-CM | POA: Insufficient documentation

## 2024-01-06 DIAGNOSIS — Z91040 Latex allergy status: Secondary | ICD-10-CM | POA: Insufficient documentation

## 2024-01-06 MED ORDER — AMOXICILLIN-POT CLAVULANATE 875-125 MG PO TABS
1.0000 | ORAL_TABLET | Freq: Two times a day (BID) | ORAL | 0 refills | Status: AC
Start: 2024-01-06 — End: 2024-01-13

## 2024-01-06 MED ORDER — AMOXICILLIN-POT CLAVULANATE 875-125 MG PO TABS
1.0000 | ORAL_TABLET | Freq: Once | ORAL | Status: AC
  Administered 2024-01-06: 1 via ORAL
  Filled 2024-01-06: qty 1

## 2024-01-06 MED ORDER — OXYCODONE-ACETAMINOPHEN 5-325 MG PO TABS
1.0000 | ORAL_TABLET | Freq: Once | ORAL | Status: AC
  Administered 2024-01-06: 1 via ORAL
  Filled 2024-01-06: qty 1

## 2024-01-06 NOTE — ED Provider Notes (Signed)
 [place] EMERGENCY DEPARTMENT AT [facility] Provider Note   CSN: [id number] Arrival date & time: 01/06/24  1816     History Chief Complaint  Patient presents with   Dental Pain    Carol Dominguez is a 73 y.o. female.  Patient presents to the emergency department today with concerns of dental pain.  Past history significant for tobacco use, protein calorie malnutrition.  She states that this has been an ongoing issue for several years but has not followed up with a dentist for management of her teeth.  She states that she noted starting today left upper dental pain but no obvious drainage present.  Denies any recent fever, chills, body aches or difficulty swallowing.   Dental Pain      Home Medications Prior to Admission medications   Medication Sig Start Date End Date Taking? Authorizing Provider  amoxicillin -clavulanate (AUGMENTIN ) 875-125 MG tablet Take 1 tablet by mouth every 12 (twelve) hours for 7 days. 01/06/24 01/13/24 Yes Elea Holtzclaw A, PA-C  Aspirin-Salicylamide-Caffeine (BC HEADACHE PO) Take 1 packet by mouth daily as needed (headache).    [provider]  BENADRYL  ALLERGY 25 MG tablet Take 25 mg by mouth every 6 (six) hours as needed for allergies.    [provider]      Allergies    Codeine and Latex    Review of Systems   Review of Systems  HENT:  Positive for dental problem.   All other systems reviewed and are negative.   Physical Exam Updated Vital Signs BP (!) 172/97 (BP Location: Right Arm)   Pulse 85   Temp 99.4 F (37.4 C)   Resp 15   Ht 5\' 9"  (1.753 m)   Wt 54.4 kg   SpO2 97%   BMI 17.71 kg/m  Physical Exam Vitals and nursing note reviewed.  Constitutional:      General: She is not in acute distress.    Appearance: She is well-developed.  HENT:     Head: Normocephalic and atraumatic.     Comments: No obvious unilateral facial or jaw swelling.    Mouth/Throat:      Comments: Small firm gumboil  present to the anterior surface of teeth 9-10, but no appreciable fluctuance. Eyes:     Conjunctiva/sclera: Conjunctivae normal.  Cardiovascular:     Rate and Rhythm: Normal rate and regular rhythm.     Heart sounds: No murmur heard. Pulmonary:     Effort: Pulmonary effort is normal. No respiratory distress.     Breath sounds: Normal breath sounds.  Abdominal:     Palpations: Abdomen is soft.     Tenderness: There is no abdominal tenderness.  Musculoskeletal:        General: No swelling.     Cervical back: Neck supple.  Skin:    General: Skin is warm and dry.     Capillary Refill: Capillary refill takes less than 2 seconds.  Neurological:     Mental Status: She is alert.  Psychiatric:        Mood and Affect: Mood normal.     ED Results / Procedures / Treatments   Labs (all labs ordered are listed, but only abnormal results are displayed) Labs Reviewed - No data to display  EKG None  Radiology No results found.  Procedures Procedures    Medications Ordered in ED Medications  amoxicillin -clavulanate (AUGMENTIN ) 875-125 MG per tablet 1 tablet (1 tablet Oral Given 01/06/24 1939)  oxyCODONE -acetaminophen  (PERCOCET/ROXICET) 5-325 MG per tablet  1 tablet (1 tablet Oral Given 01/06/24 1939)    ED Course/ Medical Decision Making/ A&P                                 Medical Decision Making Risk Prescription drug management.   This patient presents to the ED for concern of dental pain.  Differential diagnosis includes dental infection, dental abscess, gingivitis, aphthous ulcer   Medicines ordered and prescription drug management:  I ordered medication including Augmentin , Percocet for dental infection, pain Reevaluation of the patient after these medicines showed that the patient improved I have reviewed the patients home medicines and have made adjustments as needed   Problem List / ED Course:  Patient with history of protein calorie malnutrition, tobacco use  presents the emergency department today with concerns of dental pain.  Reports has been going on for several years but has not been seen by dentist for definitive treatment.  She reports that she noted dental pain to the left upper side this morning.  She is here for evaluation of this dental pain. On exam, left upper dentition reveals a small gum boil appearing area to the anterior portion of teeth 9 and 10.  Possible gum boil but no fluctuance palpated.  There is significant dental erosion and caries present in multiple sites.  Findings are consistent with likely dental infection with possible developing dental abscess.  No evidence of peritonsillar abscess or retropharyngeal abscess based on unremarkable oropharynx.  Will start patient on a course of Augmentin  with first dose given here in the emergency department as well as 1 single time dose of Percocet. Printed out prescription for Augmentin  for patient to take to any pharmacy of her choice.  Once again reiterated that patient requires dental evaluation for definitive treatment.  Encourage patient to return the emergency department for any concerns or new or worsening symptoms.  She is otherwise stable at this time for outpatient follow-up and discharged home.  Final Clinical Impression(s) / ED Diagnoses Final diagnoses:  Dental infection    Rx / DC Orders ED Discharge Orders          Ordered    amoxicillin -clavulanate (AUGMENTIN ) 875-125 MG tablet  Every 12 hours        01/06/24 1934              Cythia Bachtel A, PA-C 01/06/24 2006    Mordecai Applebaum, MD 01/09/24 [phone]

## 2024-01-06 NOTE — ED Triage Notes (Signed)
 The pt has had a toothache all day   she has some sl facial swelling on the lt  she has had dental problems for a year

## 2024-01-06 NOTE — Discharge Instructions (Signed)
 You were seen in the emergency department today for concerns of dental pain.  You do appear to have a dental infection that is developing.  I started you on Augmentin  which is an antibiotic they will take twice daily for the next 7 days.  You do need to be seen by dentist for management of this infection. Take Tylenol  or ibuprofen  for pain as needed.  Return the ER for any worsening symptoms.

## 2024-03-25 ENCOUNTER — Emergency Department (HOSPITAL_COMMUNITY)
Admission: EM | Admit: 2024-03-25 | Discharge: 2024-03-25 | Disposition: A | Discharge status: Home / Self Care | Attending: Emergency Medicine | Admitting: Emergency Medicine

## 2024-03-25 ENCOUNTER — Encounter (HOSPITAL_COMMUNITY): Payer: Self-pay

## 2024-03-25 ENCOUNTER — Other Ambulatory Visit: Payer: Self-pay

## 2024-03-25 DIAGNOSIS — K0889 Other specified disorders of teeth and supporting structures: Secondary | ICD-10-CM | POA: Insufficient documentation

## 2024-03-25 MED ORDER — AMOXICILLIN-POT CLAVULANATE 875-125 MG PO TABS: 1.0000 | ORAL_TABLET | Freq: Two times a day (BID) | ORAL | 0 refills | Status: AC

## 2024-03-25 NOTE — Discharge Instructions (Addendum)
 Please follow-up with a dentist.  I have written you prescription for an antibiotic that will help with the inflammation and pain you have in your teeth however this will not solve the ultimate issue which is you have severe decay of your teeth.  Please take all of your antibiotics until finished!   You may develop abdominal discomfort or diarrhea from the antibiotic.  You may help offset this with probiotics which you can buy or get in yogurt. Do not eat  or take the probiotics until 2 hours after your antibiotic.     I have given you the information for a dentist that you may follow-up with.  You may also follow-up with a different to this if you already established with one.

## 2024-03-25 NOTE — ED Triage Notes (Signed)
 Patient reports that all her teeth are infected and inflamed and she needs to see the dentist but before that she needs some antibiotics.

## 2024-03-25 NOTE — ED Provider Notes (Signed)
 [place] [facility] Provider Note   CSN: [id number] Arrival date & time: 03/25/24  1148     History   Chief Complaint Chief Complaint  Patient presents with   Dental Problem    HPI Carol Dominguez is a 73 y.o. female.  Patient presents to the emergency department with a dental complaint. Symptoms began several days ago. The patient has tried to alleviate pain with nothing.  Pain rated as moderate to severe, characterized as throbbing in nature and located left upper dentition. Patient denies fever, night sweats, chills, difficulty swallowing or opening mouth, SOB, nuchal rigidity or decreased ROM of neck.  Patient does not have a dentist and requests a resource guide at discharge.      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 Joshua   LAPAROSCOPIC LOW ANTERIOR RESECTION N/A 04/09/2015   Procedure: LAPAROSCOPIC LOW ANTERIOR RESECTION;  Surgeon: Elspeth Schultze, MD;  Location: [facility];  Service: General;  Laterality: N/A;   OVARIAN CYST REMOVAL Right 1975   Dr. Joshua   RECTOPEXY N/A 04/09/2015   Procedure: RECTOPEXY;  Surgeon: Elspeth Schultze, MD;  Location: [facility];  Service: General;  Laterality: N/A;     OB History   No obstetric history on file.      Home Medications    Prior to Admission medications   Medication Sig Start Date End Date Taking? Authorizing Provider  amoxicillin -clavulanate (AUGMENTIN ) 875-125 MG tablet Take 1 tablet by mouth every 12 (twelve) hours. 03/25/24  Yes Azrael Maddix, Hamp S, PA  Aspirin-Salicylamide-Caffeine (BC HEADACHE PO) Take 1 packet by mouth daily as needed (headache).    [provider]  BENADRYL  ALLERGY 25 MG tablet Take 25 mg by mouth every 6 (six) hours as needed for allergies.    [provider]    Family History Family History  Problem Relation Age of Onset   Pancreatic cancer Mother 71   Aneurysm Father    Other Sister    Sickle cell anemia Brother    Colon cancer Neg Hx    Esophageal cancer Neg Hx    Stomach cancer Neg Hx    Colon polyps Neg Hx  Rectal cancer Neg Hx     Social History Social History   Tobacco Use   Smoking status: Light Smoker    Current packs/day: 0.25    Types: Cigarettes   Smokeless tobacco: Never   Tobacco comments:    occasional  Vaping Use   Vaping status: Never Used  Substance Use Topics   Alcohol use: Not Currently    Comment: rare   Drug use: Not Currently    Types: Cocaine     Allergies   Codeine and Latex   Review of Systems Denies fevers, chills, difficulty swallowing or eating, changes in voice, pain under tongue, nausea, vomiting, lightheadedness or dizziness. No trismus   Physical Exam Updated Vital Signs BP 135/79   Pulse 63   Temp 98 F (36.7 C)   Resp 18   Ht  5' 9 (1.753 m)   Wt 57.6 kg   SpO2 97%   BMI 18.75 kg/m   Physical Exam Physical Exam  Constitutional: Pt appears well-developed and well-nourished.  HENT:  Head: Normocephalic.  Right Ear: Tympanic membrane, external ear and ear canal normal.  Left Ear: Tympanic membrane, external ear and ear canal normal.  Nose: Nose normal. Right sinus exhibits no maxillary sinus tenderness and no frontal sinus tenderness. Left sinus exhibits no maxillary sinus tenderness and no frontal sinus tenderness.  Mouth/Throat: Uvula is midline, oropharynx is clear and moist and mucous membranes are normal. No oral lesions. No uvula swelling or lacerations. No oropharyngeal exudate, posterior oropharyngeal edema, posterior oropharyngeal erythema or tonsillar abscesses.  Poor dentition No gingival swelling, fluctuance or induration No gross abscess  No sublingual edema, tenderness to palpation, or sign of Ludwig's angina, or deep space infection Pain at left upper gumline no fluctuance Eyes: Conjunctivae are normal. Pupils are equal, round, and reactive to light. Right eye exhibits no discharge. Left eye exhibits no discharge.  Neck: Normal range of motion. Neck supple.  No stridor Handling secretions without difficulty No nuchal rigidity No cervical lymphadenopathy Cardiovascular: Normal rate, regular rhythm and normal heart sounds.   Pulmonary/Chest: Effort normal. No respiratory distress.  Equal chest rise  Abdominal: Soft. Bowel sounds are normal. Pt exhibits no distension. There is no tenderness.  Lymphadenopathy: Pt has no cervical adenopathy.  Neurological: Pt is alert and oriented x 4  Skin: Skin is warm and dry.  Psychiatric: Pt has a normal mood and affect.  Nursing note and vitals reviewed.   ED Treatments / Results  Labs (all labs ordered are listed, but only abnormal results are displayed) Labs Reviewed - No data to display  EKG    Radiology No results  found.  Procedures Procedures (including critical care time)  Medications Ordered in ED Medications - No data to display   Initial Impression / Assessment and Plan / ED Course  I have reviewed the triage vital signs and the nursing notes.  Pertinent labs & imaging results that were available during my care of the patient were reviewed by me and considered in my medical decision making (see chart for details).        Patient with dentalgia.  No abscess requiring immediate incision and drainage.  Exam not concerning for Ludwig's angina or pharyngeal abscess.  Will treat with Augmentin . Pt instructed to follow-up with dentist.  Discussed return precautions. Pt safe for discharge. She understands need to follow-up with dentistry.  Final Clinical Impressions(s) / ED Diagnoses   Final diagnoses:  Pain, dental    ED Discharge Orders  Ordered    amoxicillin -clavulanate (AUGMENTIN ) 875-125 MG tablet  Every 12 hours        03/25/24 1321              Neldon Hamp RAMAN, [place] 03/25/24 1555    Elnor Jayson LABOR, DO 03/25/24 1600

## 2024-04-30 ENCOUNTER — Encounter: Payer: Self-pay | Admitting: Gastroenterology

## 2024-06-23 ENCOUNTER — Ambulatory Visit: Admitting: Gastroenterology

## 2024-08-11 ENCOUNTER — Ambulatory Visit: Admitting: Gastroenterology

## 2024-08-11 NOTE — Progress Notes (Deleted)
 "  Chief Complaint: Primary GI MD:  HPI: 74 year old female with history of intussusception of colon mass in the rectum without obstruction s/p lap LAR/rectopexy 04/2015   Discussed the use of AI scribe software for clinical note transcription with the patient, who gave verbal consent to proceed.  History of Present Illness      PREVIOUS GI WORKUP   Colonoscopy 09/2018 - One 5 mm polyp in the transverse colon, removed with a cold snare. Resected and retrieved.  - Two 4 to 5 mm polyps at the splenic flexure, removed with a cold snare. Resected and retrieved.  - One 4 mm polyp in the descending colon, removed with a cold snare. Resected and retrieved.  - Patent end- to- side colo- colonic anastomosis, characterized by healthy appearing mucosa.  Diagnosis 1. Surgical [P], splenic flexure and transverse, polyp (3) - TUBULAR ADENOMA (1 OF 4 FRAGMENTS) - HYPERPLASTIC POLYP (2 OF 4 FRAGMENTS) - BENIGN COLONIC MUCOSA (1 OF 4 FRAGMENTS) - NO HIGH GRADE DYSPLASIA OR MALIGNANCY IDENTIFIED 2. Surgical [P], descending; polyp - HYPERPLASTIC POLYP (2 OF 2 FRAGMENTS) - NO HIGH GRADE DYSPLASIA OR MALIGNANCY IDENTIFIE  Colonosocpy 08/2015 1. Five sessile polyps ranging between 3- 7mm in size were found at the cecum and in the ascending colon; polypectomies were performed with a cold snare  2. Five sessile and pedunculated polyps ranging from 6 to 20mm in size were found in the transverse colon, descending colon, and sigmoid colon; polypectomies were performed using snare cautery  3. Five sessile and semi- pedunculated polyps ranging between 3- 7mm in size were found in the transverse colon and descending colon; polypectomies were performed with a cold snare  4. There was evidence of prior surgical anastomosis  Past Medical History:  Diagnosis Date   Anemia    Asthma    Cancer (HCC) 04/2015   colon cancer    Depression    Foot fracture, right    Iron deficiency    Ovarian cyst 1975   [facility]    Teeth decayed    Tobacco use    Tuberculosis 1973   was positive and was treated    Past Surgical History:  Procedure Laterality Date   BREAST EXCISIONAL BIOPSY Right    COLON SURGERY  2016   COLONOSCOPY  09/2018   JMP-MAC-suprep(good)-evidence of prior end-end anastamosis rectosigmoid/HPP frag/TA x 4 frag   CYST EXCISION Bilateral 1973   Right ac and left wrist    DILATION AND CURETTAGE OF UTERUS     INGUINAL HERNIA REPAIR Right    [age] y/o - Dr. Joshua   LAPAROSCOPIC LOW ANTERIOR RESECTION N/A 04/09/2015   Procedure: LAPAROSCOPIC LOW ANTERIOR RESECTION;  Surgeon: Elspeth Schultze, MD;  Location: [facility];  Service: General;  Laterality: N/A;   OVARIAN CYST REMOVAL Right 1975   Dr. Joshua   RECTOPEXY N/A 04/09/2015   Procedure: RECTOPEXY;  Surgeon: Elspeth Schultze, MD;  Location: [facility];  Service: General;  Laterality: N/A;    Current Outpatient Medications  Medication Sig Dispense Refill   amoxicillin -clavulanate (AUGMENTIN ) 875-125 MG tablet Take 1 tablet by mouth every 12 (twelve) hours. 14 tablet 0   Aspirin-Salicylamide-Caffeine (BC HEADACHE PO) Take 1 packet by mouth daily as needed (headache).     BENADRYL  ALLERGY 25 MG tablet Take 25 mg by mouth every 6 (six) hours as needed for allergies.     No current facility-administered medications for this visit.    Allergies as of 08/11/2024 - Review Complete 03/25/2024  Allergen  Reaction Noted   Codeine Nausea And Vomiting and Other (See Comments) 04/04/2015   Latex Rash 04/27/2022    Family History  Problem Relation Age of Onset   Pancreatic cancer Mother 31   Aneurysm Father    Other Sister    Sickle cell anemia Brother    Colon cancer Neg Hx    Esophageal cancer Neg Hx    Stomach cancer Neg Hx    Colon polyps Neg Hx    Rectal cancer Neg Hx     Social History   Socioeconomic History   Marital status: Widowed    Spouse name: Not on file   Number of children: Not on file   Years of education: Not on file   Highest  education level: Not on file  Occupational History   Not on file  Tobacco Use   Smoking status: Light Smoker    Current packs/day: 0.25    Types: Cigarettes   Smokeless tobacco: Never   Tobacco comments:    occasional  Vaping Use   Vaping status: Never Used  Substance and Sexual Activity   Alcohol use: Not Currently    Comment: rare   Drug use: Not Currently    Types: Cocaine   Sexual activity: Not on file  Other Topics Concern   Not on file  Social History Narrative   Not on file   Social Drivers of Health   Tobacco Use: High Risk (03/25/2024)   Patient History    Smoking Tobacco Use: Light Smoker    Smokeless Tobacco Use: Never    Passive Exposure: Not on file  Financial Resource Strain: Not on file  Food Insecurity: Not on file  Transportation Needs: Not on file  Physical Activity: Not on file  Stress: Not on file  Social Connections: Not on file  Intimate Partner Violence: Not on file  Depression (PHQ2-9): Not on file  Alcohol Screen: Not on file  Housing: Not on file  Utilities: Not on file  Health Literacy: Not on file    Review of Systems:    Constitutional: No weight loss, fever, chills, weakness or fatigue HEENT: Eyes: No change in vision               Ears, Nose, Throat:  No change in hearing or congestion Skin: No rash or itching Cardiovascular: No chest pain, chest pressure or palpitations   Respiratory: No SOB or cough Gastrointestinal: See HPI and otherwise negative Genitourinary: No dysuria or change in urinary frequency Neurological: No headache, dizziness or syncope Musculoskeletal: No new muscle or joint pain Hematologic: No bleeding or bruising Psychiatric: No history of depression or anxiety    Physical Exam:  Vital signs: There were no vitals taken for this visit.  Constitutional: NAD, alert and cooperative Head:  Normocephalic and atraumatic. Eyes:   PEERL, EOMI. No icterus. Conjunctiva pink. Respiratory: Respirations even and  unlabored. Lungs clear to auscultation bilaterally.   No wheezes, crackles, or rhonchi.  Cardiovascular:  Regular rate and rhythm. No peripheral edema, cyanosis or pallor.  Gastrointestinal:  Soft, nondistended, nontender. No rebound or guarding. Normal bowel sounds. No appreciable masses or hepatomegaly. Rectal:  Declines Msk:  Symmetrical without gross deformities. Without edema, no deformity or joint abnormality.  Neurologic:  Alert and  oriented x4;  grossly normal neurologically.  Skin:   Dry and intact without significant lesions or rashes. Psychiatric: Oriented to person, place and time. Demonstrates good judgement and reason without abnormal affect or behaviors.  Physical Exam  RELEVANT LABS AND IMAGING: CBC    Component Value Date/Time   WBC 7.1 05/07/2023 0742   RBC 4.24 05/07/2023 0742   HGB 13.2 05/07/2023 0742   HCT 40.7 05/07/2023 0742   PLT 253 05/07/2023 0742   MCV 96.0 05/07/2023 0742   MCH 31.1 05/07/2023 0742   MCHC 32.4 05/07/2023 0742   RDW 13.7 05/07/2023 0742   LYMPHSABS 1.8 04/27/2022 1703   MONOABS 0.3 04/27/2022 1703   EOSABS 0.2 04/27/2022 1703   BASOSABS 0.1 04/27/2022 1703    CMP     Component Value Date/Time   NA 141 05/07/2023 0742   K 3.7 05/07/2023 0742   CL 105 05/07/2023 0742   CO2 24 05/07/2023 0742   GLUCOSE 126 (H) 05/07/2023 0742   BUN 18 05/07/2023 0742   CREATININE 1.01 (H) 05/07/2023 0742   CALCIUM 9.3 05/07/2023 0742   PROT 7.5 05/07/2023 0742   ALBUMIN 4.0 05/07/2023 0742   AST 21 05/07/2023 0742   ALT 23 05/07/2023 0742   ALKPHOS 71 05/07/2023 0742   BILITOT 0.6 05/07/2023 0742   GFRNONAA 60 (L) 05/07/2023 0742   GFRAA 59 (L) 09/23/2016 0649     Assessment/Plan:    Colonoscopy 09/2018 - One 5 mm polyp in the transverse colon, removed with a cold snare. Resected and retrieved.  - Two 4 to 5 mm polyps at the splenic flexure, removed with a cold snare. Resected and retrieved.  - One 4 mm polyp in the descending  colon, removed with a cold snare. Resected and retrieved.  - Patent end- to- side colo- colonic anastomosis, characterized by healthy appearing mucosa.  Diagnosis 1. Surgical [P], splenic flexure and transverse, polyp (3) - TUBULAR ADENOMA (1 OF 4 FRAGMENTS) - HYPERPLASTIC POLYP (2 OF 4 FRAGMENTS) - BENIGN COLONIC MUCOSA (1 OF 4 FRAGMENTS) - NO HIGH GRADE DYSPLASIA OR MALIGNANCY IDENTIFIED 2. Surgical [P], descending; polyp - HYPERPLASTIC POLYP (2 OF 2 FRAGMENTS) - NO HIGH GRADE DYSPLASIA OR MALIGNANCY IDENTIFIE  Colonosocpy 08/2015 1. Five sessile polyps ranging between 3- 7mm in size were found at the cecum and in the ascending colon; polypectomies were performed with a cold snare  2. Five sessile and pedunculated polyps ranging from 6 to 20mm in size were found in the transverse colon, descending colon, and sigmoid colon; polypectomies were performed using snare cautery  3. Five sessile and semi- pedunculated polyps ranging between 3- 7mm in size were found in the transverse colon and descending colon; polypectomies were performed with a cold snare  4. There was evidence of prior surgical anastomosis  Personal history of colon cancer s/p LAR 04/2015 History of colon polyps Last colonoscopy 09/2018 with 3 tubular adenomas and recall 5 years  History of tuberculosis History of tuberculosis s/p treatment 1973  Carol Dominguez [facility] 08/11/2024, 9:31 AM  Cc: Claudene Prentice DELENA Mickey., FNP "

## 2024-08-22 ENCOUNTER — Emergency Department (HOSPITAL_COMMUNITY)

## 2024-08-22 ENCOUNTER — Emergency Department (HOSPITAL_COMMUNITY)
Admission: EM | Admit: 2024-08-22 | Discharge: 2024-08-23 | Disposition: A | Discharge status: Home / Self Care | Attending: Emergency Medicine | Admitting: Emergency Medicine

## 2024-08-22 ENCOUNTER — Encounter (HOSPITAL_COMMUNITY): Payer: Self-pay

## 2024-08-22 ENCOUNTER — Emergency Department (HOSPITAL_COMMUNITY): Admission: EM | Admit: 2024-08-22 | Discharge: 2024-08-22 | Disposition: A | Discharge status: Home / Self Care

## 2024-08-22 ENCOUNTER — Other Ambulatory Visit: Payer: Self-pay

## 2024-08-22 DIAGNOSIS — Z720 Tobacco use: Secondary | ICD-10-CM | POA: Diagnosis not present

## 2024-08-22 DIAGNOSIS — Z91040 Latex allergy status: Secondary | ICD-10-CM | POA: Insufficient documentation

## 2024-08-22 DIAGNOSIS — J101 Influenza due to other identified influenza virus with other respiratory manifestations: Secondary | ICD-10-CM | POA: Insufficient documentation

## 2024-08-22 DIAGNOSIS — R509 Fever, unspecified: Secondary | ICD-10-CM | POA: Diagnosis present

## 2024-08-22 DIAGNOSIS — J45909 Unspecified asthma, uncomplicated: Secondary | ICD-10-CM | POA: Insufficient documentation

## 2024-08-22 DIAGNOSIS — J111 Influenza due to unidentified influenza virus with other respiratory manifestations: Secondary | ICD-10-CM

## 2024-08-22 DIAGNOSIS — R062 Wheezing: Secondary | ICD-10-CM | POA: Diagnosis present

## 2024-08-22 DIAGNOSIS — F17200 Nicotine dependence, unspecified, uncomplicated: Secondary | ICD-10-CM | POA: Insufficient documentation

## 2024-08-22 LAB — CBC
HCT: 42.3 % (ref 36.0–46.0)
Hemoglobin: 13.8 g/dL (ref 12.0–15.0)
MCH: 29.8 pg (ref 26.0–34.0)
MCHC: 32.6 g/dL (ref 30.0–36.0)
MCV: 91.4 fL (ref 80.0–100.0)
Platelets: 227 K/uL (ref 150–400)
RBC: 4.63 MIL/uL (ref 3.87–5.11)
RDW: 13.2 % (ref 11.5–15.5)
WBC: 6.4 K/uL (ref 4.0–10.5)
nRBC: 0 % (ref 0.0–0.2)

## 2024-08-22 LAB — BASIC METABOLIC PANEL WITH GFR
Anion gap: 9 (ref 5–15)
BUN: 10 mg/dL (ref 8–23)
CO2: 27 mmol/L (ref 22–32)
Calcium: 9.2 mg/dL (ref 8.9–10.3)
Chloride: 99 mmol/L (ref 98–111)
Creatinine, Ser: 1.05 mg/dL — ABNORMAL HIGH (ref 0.44–1.00)
GFR, Estimated: 57 mL/min — ABNORMAL LOW
Glucose, Bld: 116 mg/dL — ABNORMAL HIGH (ref 70–99)
Potassium: 4.4 mmol/L (ref 3.5–5.1)
Sodium: 135 mmol/L (ref 135–145)

## 2024-08-22 LAB — RESP PANEL BY RT-PCR (RSV, FLU A&B, COVID)  RVPGX2
Influenza A by PCR: POSITIVE — AB
Influenza B by PCR: NEGATIVE
Resp Syncytial Virus by PCR: NEGATIVE
SARS Coronavirus 2 by RT PCR: NEGATIVE

## 2024-08-22 LAB — TROPONIN T, HIGH SENSITIVITY: Troponin T High Sensitivity: <15 ng/L (ref 0–19)

## 2024-08-22 MED ORDER — IPRATROPIUM-ALBUTEROL 0.5-2.5 (3) MG/3ML IN SOLN
3.0000 mL | Freq: Once | RESPIRATORY_TRACT | Status: AC
  Administered 2024-08-22: 3 mL via RESPIRATORY_TRACT
  Filled 2024-08-22: qty 3

## 2024-08-22 MED ORDER — ONDANSETRON 4 MG PO TBDP: 4.0000 mg | ORAL_TABLET | Freq: Four times a day (QID) | ORAL | 0 refills | Status: AC | PRN

## 2024-08-22 MED ORDER — OSELTAMIVIR PHOSPHATE 75 MG PO CAPS
75.0000 mg | ORAL_CAPSULE | Freq: Once | ORAL | Status: AC
  Administered 2024-08-22: 75 mg via ORAL
  Filled 2024-08-22: qty 1

## 2024-08-22 MED ORDER — OSELTAMIVIR PHOSPHATE 30 MG PO CAPS: 30.0000 mg | ORAL_CAPSULE | Freq: Two times a day (BID) | ORAL | 0 refills | Status: AC

## 2024-08-22 MED ORDER — ACETAMINOPHEN 500 MG PO TABS
1000.0000 mg | ORAL_TABLET | Freq: Once | ORAL | Status: AC
  Administered 2024-08-22: 1000 mg via ORAL
  Filled 2024-08-22: qty 2

## 2024-08-22 MED ORDER — ALBUTEROL SULFATE HFA 108 (90 BASE) MCG/ACT IN AERS: 1.0000 | INHALATION_SPRAY | Freq: Four times a day (QID) | RESPIRATORY_TRACT | 2 refills | Status: AC | PRN

## 2024-08-22 MED ORDER — SODIUM CHLORIDE 0.9 % IV BOLUS
1000.0000 mL | Freq: Once | INTRAVENOUS | Status: AC
  Administered 2024-08-22: 1000 mL via INTRAVENOUS

## 2024-08-22 NOTE — ED Provider Notes (Signed)
 "  [place] [facility] Provider Note   CSN: [id number] Arrival date & time: 08/22/24  1029     Patient presents with: flu like syptoms   Carol Dominguez is a 74 y.o. female with past medical history significant for previous rectal prolapse, tobacco use, asthma who presents concern for flulike symptoms, chest pain, congestion, fever, weakness.  Does endorse sick contacts with someone that she thinks probably had the flu last week.  Denies any nausea, vomiting.  Endorses some mild sore throat.  Nothing for symptoms prior to arrival.     HPI     Prior to Admission medications  Medication Sig Start Date End Date Taking? Authorizing Provider  albuterol  (VENTOLIN  HFA) 108 (90 Base) MCG/ACT inhaler Inhale 1-2 puffs into the lungs every 6 (six) hours as needed for wheezing or shortness of breath. 08/22/24  Yes Galaxy Borden H, PA-C  ondansetron  (ZOFRAN -ODT) 4 MG disintegrating tablet Take 1 tablet (4 mg total) by mouth every 6 (six) hours as needed for nausea or vomiting. 08/22/24  Yes Carolynne Schuchard H, PA-C  oseltamivir  (TAMIFLU ) 30 MG capsule Take 1 capsule (30 mg total) by mouth 2 (two) times daily. 08/22/24  Yes Elaine Middleton H, PA-C  amoxicillin -clavulanate (AUGMENTIN ) 875-125 MG tablet Take 1 tablet by mouth every 12 (twelve) hours. Patient not taking: Reported on 08/22/2024 03/25/24   Neldon Hamp RAMAN, PA    Allergies: Codeine and Latex    Review of Systems  All other systems reviewed and are negative.   Updated Vital Signs BP 131/67   Pulse 90   Temp (!) 102.4 F (39.1 C)   Resp (!) 26   Ht 5' 9 (1.753 m)   Wt 56.7 kg   SpO2 94%   BMI 18.46 kg/m   Physical Exam Vitals and nursing note reviewed.  Constitutional:      General: She is not in acute distress.    Appearance: Normal appearance.  HENT:     Head: Normocephalic and atraumatic.  Eyes:     General:        Right eye: No discharge.        Left eye: No  discharge.  Cardiovascular:     Rate and Rhythm: Normal rate and regular rhythm.     Heart sounds: No murmur heard.    No friction rub. No gallop.  Pulmonary:     Effort: Pulmonary effort is normal.     Breath sounds: Normal breath sounds.     Comments: Mild expiratory wheezing, coarse rhonchi throughout Abdominal:     General: Bowel sounds are normal.     Palpations: Abdomen is soft.  Skin:    General: Skin is warm and dry.     Capillary Refill: Capillary refill takes less than 2 seconds.  Neurological:     Mental Status: She is alert and oriented to person, place, and time.  Psychiatric:        Mood and Affect: Mood normal.        Behavior: Behavior normal.     (all labs ordered are listed, but only abnormal results are displayed) Labs Reviewed  RESP PANEL BY RT-PCR (RSV, FLU A&B, COVID)  RVPGX2 - Abnormal; Notable for the following components:      Result Value   Influenza A by PCR POSITIVE (*)    All other components within normal limits  BASIC METABOLIC PANEL WITH GFR - Abnormal; Notable for the following components:   Glucose, Bld 116 (*)  Creatinine, Ser 1.05 (*)    GFR, Estimated 57 (*)    All other components within normal limits  CBC  TROPONIN T, HIGH SENSITIVITY    EKG: EKG Interpretation Date/Time:  Friday August 22 2024 10:53:06 EST Ventricular Rate:  89 PR Interval:  114 QRS Duration:  76 QT Interval:  347 QTC Calculation: 423 R Axis:   77  Text Interpretation: Sinus rhythm Borderline short PR interval Right atrial enlargement Consider left ventricular hypertrophy Nonspecific T abnrm, anterolateral leads Confirmed by Ula Barter [phone]) on 08/22/2024 10:57:53 AM  Radiology: [facility] Chest Portable 1 View Result Date: 08/22/2024 CLINICAL DATA:  Shortness of breath. EXAM: PORTABLE CHEST 1 VIEW COMPARISON:  05/07/2023. FINDINGS: The heart size and mediastinal contours are within normal limits. Aortic atherosclerosis. Chronic emphysema. No overt pulmonary  edema, focal consolidation, pleural effusion, or pneumothorax. No acute osseous abnormality. IMPRESSION: 1. No acute cardiopulmonary findings. 2. Emphysema. Electronically Signed   By: Harrietta Sherry M.D.   On: 08/22/2024 11:54     Procedures   Medications Ordered in the ED  acetaminophen  (TYLENOL ) tablet 1,000 mg (1,000 mg Oral Given 08/22/24 1149)  ipratropium-albuterol  (DUONEB) 0.5-2.5 (3) MG/3ML nebulizer solution 3 mL (3 mLs Nebulization Given 08/22/24 1213)  sodium chloride  0.9 % bolus 1,000 mL (1,000 mLs Intravenous New Bag/Given 08/22/24 1343)  oseltamivir  (TAMIFLU ) capsule 75 mg (75 mg Oral Given 08/22/24 1340)                                    Medical Decision Making Amount and/or Complexity of Data Reviewed Labs: ordered. Radiology: ordered.  Risk OTC drugs. Prescription drug management.   This patient is a 74 y.o. female  who presents to the ED for concern of fever, shob, chest pain..   Differential diagnoses prior to evaluation: The emergent differential diagnosis includes, but is not limited to,  asthma exacerbation, COPD exacerbation, acute upper respiratory infection, acute bronchitis, chronic bronchitis, interstitial lung disease, ARDS, PE, pneumonia, atypical ACS, carbon monoxide poisoning, spontaneous pneumothorax, new CHF vs CHF exacerbation, versus other, high clinical suspicion for URI, covid, flu RSV . This is not an exhaustive differential.   Past Medical History / Co-morbidities / Social History: Things update tobacco use, emphysema, asthma  Physical Exam: Physical exam performed. The pertinent findings include: Quite febrile on initial arrival, temp 102.4.  Mild intermittent tachypnea, respirations around 23-24.  Borderline low oxygen  on room air, 93%, 100% on 2 L nasal cannula.  Patient is somewhat hypertensive, BP max 179/87.  Lab Tests/Imaging studies: I personally interpreted labs/imaging and the pertinent results include: CBC unremarkable, no  leukocytosis, no critical anemia or platelet dysfunction.  I independently interpreted plain film chest x-ray which shows no evidence of acute intrathoracic abnormality, chronic emphysematous changes noted.  BMP overall unremarkable, creatinine fairly stable compared to baseline, 1.05.  RVP positive for flu.  Negative troponin x 1 in context of atypical chest pain with cough I agree with the radiologist interpretation.  Cardiac monitoring: EKG obtained and interpreted by myself and attending physician which shows: Normal sinus rhythm, no acute ST changes, some left ventricular hypertrophy noted, nonspecific T wave abnormality in anterolateral leads.   Medications: I ordered medication including nebs, Tylenol  for mild wheeze, fever..  I have reviewed the patients home medicines and have made adjustments as needed.  On reassessment, ambulatory pulse ox shows 95%, however patient was feeling slightly lightheaded.  She did not have  any sudden onset of tachycardia.  Given her flu suspect that she may have some degree of mild dehydration with her lightheadedness, will administer fluid bolus and reassess.   Disposition: After consideration of the diagnostic results and the patients response to treatment, I feel that patient is stable for discharge, will discharge with Tamiflu , refill patient's home albuterol , encouraged Tylenol , ibuprofen , supportive care.   emergency department workup does not suggest an emergent condition requiring admission or immediate intervention beyond what has been performed at this time. The plan is: as above. The patient is safe for discharge and has been instructed to return immediately for worsening symptoms, change in symptoms or any other concerns.  Final diagnoses:  Flu    ED Discharge Orders          Ordered    ondansetron  (ZOFRAN -ODT) 4 MG disintegrating tablet  Every 6 hours PRN        08/22/24 1436    oseltamivir  (TAMIFLU ) 30 MG capsule  2 times daily         08/22/24 1436    albuterol  (VENTOLIN  HFA) 108 (90 Base) MCG/ACT inhaler  Every 6 hours PRN        08/22/24 1436               Harace Mccluney, Elgin H, PA-C 08/22/24 1436    Ula Prentice SAUNDERS, MD 08/22/24 1554  "

## 2024-08-22 NOTE — Discharge Instructions (Signed)
 You have the flu this is a viral infection that will likely start to improve after 7-10 days, antibiotics are not helpful in treating viral infections.  You may take Tamiflu twice a day for the next 5 days this will help shorten the duration of the flu and prevent complications, this medication can cause some upset stomach, nausea and diarrhea. You may use Zofran as needed for nausea. Please make sure you are drinking plenty of fluids. You can treat your symptoms supportively with tylenol 650 mg/1000mg  and ibuprofen 600 mg every 6 hours for fevers and pains. For nasal congestion you can use Zyrtec and Flonase to help with nasal congestion. To treat cough you can use over the counter cough medications such as Mucinex DM or Robitussin and throat lozenges. If your symptoms are not improving please follow up with you Primary doctor.   If you develop persistent fevers, shortness of breath or difficulty breathing, chest pain, severe headache and neck pain, persistent nausea and vomiting or other new or concerning symptoms return to the Emergency department.

## 2024-08-22 NOTE — ED Triage Notes (Addendum)
 Pt was upstairs visiting family, transported down here by RN. Ptreports flu like symptoms for the past 2 days, increased weakness and reports central chest pain, feels like chest pain is related to the cough. Thinks she was running a fever last night but was too weak to get up to check. Hx cancer

## 2024-08-22 NOTE — ED Notes (Addendum)
 Attempted to ambulate pt, unsteady gait felt light headed and dizzy upon getting out of bed 95% ra, EDP made aware.

## 2024-08-22 NOTE — ED Provider Triage Note (Signed)
 Emergency Medicine Provider Triage Evaluation Note  Carol Dominguez , a 74 y.o. female  was evaluated in triage.  Pt complains of continued shortness of breath/wheezing.  Was seen in the emergency department today and was diagnosed with influenza, was given prescription for Tamiflu  as well as Zofran  and albuterol  inhaler.  Patient notes a history of asthma, reports after losing moves his colon she felt increasingly more short of breath therefore called EMS.  Review of Systems  Positive: As above Negative: As above  Physical Exam  BP 139/84 (BP Location: Right Arm)   Pulse 95   Temp 100 F (37.8 C) (Oral)   Resp 20   SpO2 97%  Gen:   Awake, no distress   Resp:  Normal effort , mild wheeze, rhonchi, infrequent cough MSK:   Moves extremities without difficulty  Other:    Medical Decision Making  Medically screening exam initiated at 4:54 PM.  Appropriate orders placed.  Carol Dominguez was informed that the remainder of the evaluation will be completed by another provider, this initial triage assessment does not replace that evaluation, and the importance of remaining in the ED until their evaluation is complete.     Carol Dominguez, [place] 08/22/24 1655

## 2024-08-22 NOTE — ED Triage Notes (Signed)
 Pt BIB EMS [place] the bus stop for SOB, wheezing, and cough. Pt was discharged today from [facility] and was dx with the flu. Pt stated she's still wheezing and wanted to be seen again.  5mg  albuterol  given 0.5 atrovent

## 2024-08-23 NOTE — Discharge Instructions (Signed)
 Please take the medications as previously prescribed earlier in the day by providers at [facility].  Follow-up as needed with your primary care provider.  Return to the emergency department if you develop any life-threatening symptoms.-

## 2024-08-23 NOTE — ED Provider Notes (Signed)
 " [place] EMERGENCY DEPARTMENT AT [facility] Provider Note   CSN: [id number] Arrival date & time: 08/22/24  8371     Patient presents with: Wheezing and Cough   Carol Dominguez is a 74 y.o. female.  Patient with past medical history significant for tobacco use, asthma presents to the emergency department complaining of continued wheezing.  She was evaluated earlier today at [facility] for flulike symptoms and was diagnosed with the flu.  She went to the bus stop to go home and felt short of breath and came to this emergency department for further evaluation.  She denies any new symptoms.    Wheezing Associated symptoms: cough   Cough Associated symptoms: wheezing        Prior to Admission medications  Medication Sig Start Date End Date Taking? Authorizing Provider  albuterol  (VENTOLIN  HFA) 108 (90 Base) MCG/ACT inhaler Inhale 1-2 puffs into the lungs every 6 (six) hours as needed for wheezing or shortness of breath. 08/22/24   Prosperi, Christian H, PA-C  amoxicillin -clavulanate (AUGMENTIN ) 875-125 MG tablet Take 1 tablet by mouth every 12 (twelve) hours. Patient not taking: Reported on 08/22/2024 03/25/24   Neldon Hamp RAMAN, PA  ondansetron  (ZOFRAN -ODT) 4 MG disintegrating tablet Take 1 tablet (4 mg total) by mouth every 6 (six) hours as needed for nausea or vomiting. 08/22/24   Prosperi, Christian H, PA-C  oseltamivir  (TAMIFLU ) 30 MG capsule Take 1 capsule (30 mg total) by mouth 2 (two) times daily. 08/22/24   Prosperi, Christian H, PA-C    Allergies: Codeine and Latex    Review of Systems  Respiratory:  Positive for cough and wheezing.     Updated Vital Signs BP 126/69 (BP Location: Left Arm)   Pulse 87   Temp (!) 100.4 F (38 C) (Oral)   Resp 18   SpO2 91%   Physical Exam Vitals and nursing note reviewed.  Constitutional:      General: She is not in acute distress.    Appearance: Normal appearance.  HENT:     Head: Normocephalic and  atraumatic.  Eyes:     General:        Right eye: No discharge.        Left eye: No discharge.  Cardiovascular:     Rate and Rhythm: Normal rate and regular rhythm.     Heart sounds: No murmur heard.    No friction rub. No gallop.  Pulmonary:     Effort: Pulmonary effort is normal.     Breath sounds: Normal breath sounds.     Comments: Mild expiratory wheezing, coarse rhonchi throughout Abdominal:     General: Bowel sounds are normal.     Palpations: Abdomen is soft.  Skin:    General: Skin is warm and dry.     Capillary Refill: Capillary refill takes less than 2 seconds.  Neurological:     Mental Status: She is alert and oriented to person, place, and time.  Psychiatric:        Mood and Affect: Mood normal.        Behavior: Behavior normal.     (all labs ordered are listed, but only abnormal results are displayed) Labs Reviewed - No data to display  EKG: None  Radiology: DG Chest Portable 1 View Result Date: 08/22/2024 CLINICAL DATA:  Shortness of breath. EXAM: PORTABLE CHEST 1 VIEW COMPARISON:  05/07/2023. FINDINGS: The heart size and mediastinal contours are within normal limits. Aortic atherosclerosis. Chronic emphysema. No overt  pulmonary edema, focal consolidation, pleural effusion, or pneumothorax. No acute osseous abnormality. IMPRESSION: 1. No acute cardiopulmonary findings. 2. Emphysema. Electronically Signed   By: Harrietta Sherry M.D.   On: 08/22/2024 11:54     Procedures   Medications Ordered in the ED  ipratropium-albuterol  (DUONEB) 0.5-2.5 (3) MG/3ML nebulizer solution 3 mL (3 mLs Nebulization Given 08/22/24 2259)  acetaminophen  (TYLENOL ) tablet 1,000 mg (1,000 mg Oral Given 08/22/24 2258)                                    Medical Decision Making Risk OTC drugs.   This patient presents to the ED for concern of wheezing, this involves an extensive number of treatment options, and is a complaint that carries with it a high risk of complications and  morbidity.  The differential diagnosis includes bronchitis, asthma exacerbation, others   Co morbidities / Chronic conditions that complicate the patient evaluation  As noted in HPI   Additional history obtained:  Additional history obtained from EMR and EMS   Problem List / ED Course / Critical interventions / Medication management   I ordered medication including Tylenol , DuoNeb Reevaluation of the patient after these medicines showed that the patient improved I have reviewed the patients home medicines and have made adjustments as needed   Social Determinants of Health:  Patient is a light smoker   Test / Admission - Considered:  Patient with known influenza presents with continued shortness of breath.  She was treated with a DuoNeb and Tylenol .  Fever and symptoms improved.  She ambulated and maintained 92+ percent on room air while being monitored with pulse oximetry.  At this time I see no indication for admission.  Patient will continue to take the previously prescribed Tamiflu , albuterol  inhaler, and Zofran .  Return precautions provided.      Final diagnoses:  Influenza    ED Discharge Orders     None          Carol Dominguez 08/23/24 0057    Theadore Ozell HERO, MD 08/23/24 [phone]  "

## 2024-09-09 ENCOUNTER — Ambulatory Visit: Admitting: Gastroenterology
